# Patient Record
Sex: Female | Born: 1985
Health system: Southern US, Community
[De-identification: ages and names within clinical notes are randomized; demographics above are authoritative.]

## PROBLEM LIST (undated history)

## (undated) DIAGNOSIS — E559 Vitamin D deficiency, unspecified: Secondary | ICD-10-CM

## (undated) DIAGNOSIS — K649 Unspecified hemorrhoids: Secondary | ICD-10-CM

## (undated) DIAGNOSIS — M2550 Pain in unspecified joint: Secondary | ICD-10-CM

## (undated) DIAGNOSIS — J45909 Unspecified asthma, uncomplicated: Secondary | ICD-10-CM

## (undated) DIAGNOSIS — M549 Dorsalgia, unspecified: Secondary | ICD-10-CM

## (undated) DIAGNOSIS — M1990 Unspecified osteoarthritis, unspecified site: Secondary | ICD-10-CM

## (undated) DIAGNOSIS — E8881 Metabolic syndrome: Secondary | ICD-10-CM

## (undated) DIAGNOSIS — M4310 Spondylolisthesis, site unspecified: Secondary | ICD-10-CM

## (undated) DIAGNOSIS — K5900 Constipation, unspecified: Secondary | ICD-10-CM

## (undated) DIAGNOSIS — D649 Anemia, unspecified: Secondary | ICD-10-CM

## (undated) DIAGNOSIS — Z91018 Allergy to other foods: Secondary | ICD-10-CM

## (undated) DIAGNOSIS — E88819 Insulin resistance, unspecified: Secondary | ICD-10-CM

## (undated) HISTORY — DX: Allergy to other foods: Z91.018

## (undated) HISTORY — DX: Metabolic syndrome: E88.81

## (undated) HISTORY — DX: Insulin resistance, unspecified: E88.819

## (undated) HISTORY — DX: Pain in unspecified joint: M25.50

## (undated) HISTORY — DX: Constipation, unspecified: K59.00

## (undated) HISTORY — DX: Dorsalgia, unspecified: M54.9

## (undated) HISTORY — DX: Spondylolisthesis, site unspecified: M43.10

## (undated) HISTORY — DX: Anemia, unspecified: D64.9

## (undated) HISTORY — DX: Unspecified osteoarthritis, unspecified site: M19.90

## (undated) HISTORY — PX: TONSILECTOMY/ADENOIDECTOMY WITH MYRINGOTOMY: SHX6125

## (undated) HISTORY — DX: Unspecified hemorrhoids: K64.9

## (undated) HISTORY — DX: Vitamin D deficiency, unspecified: E55.9

---

## 2012-08-25 ENCOUNTER — Encounter (HOSPITAL_COMMUNITY): Payer: Self-pay | Admitting: *Deleted

## 2012-08-25 ENCOUNTER — Emergency Department (HOSPITAL_COMMUNITY)
Admission: EM | Admit: 2012-08-25 | Discharge: 2012-08-25 | Disposition: A | Discharge status: Home / Self Care | Payer: BC Managed Care – PPO | Attending: Emergency Medicine | Admitting: Emergency Medicine

## 2012-08-25 DIAGNOSIS — Z79899 Other long term (current) drug therapy: Secondary | ICD-10-CM | POA: Insufficient documentation

## 2012-08-25 DIAGNOSIS — O99891 Other specified diseases and conditions complicating pregnancy: Secondary | ICD-10-CM | POA: Insufficient documentation

## 2012-08-25 DIAGNOSIS — J45909 Unspecified asthma, uncomplicated: Secondary | ICD-10-CM | POA: Insufficient documentation

## 2012-08-25 DIAGNOSIS — R062 Wheezing: Secondary | ICD-10-CM | POA: Insufficient documentation

## 2012-08-25 DIAGNOSIS — T50Z95A Adverse effect of other vaccines and biological substances, initial encounter: Secondary | ICD-10-CM | POA: Insufficient documentation

## 2012-08-25 DIAGNOSIS — R0602 Shortness of breath: Secondary | ICD-10-CM | POA: Insufficient documentation

## 2012-08-25 DIAGNOSIS — T7840XA Allergy, unspecified, initial encounter: Secondary | ICD-10-CM

## 2012-08-25 HISTORY — DX: Unspecified asthma, uncomplicated: J45.909

## 2012-08-25 MED ORDER — METHYLPREDNISOLONE SODIUM SUCC 125 MG IJ SOLR
125.0000 mg | Freq: Once | INTRAMUSCULAR | Status: AC
  Administered 2012-08-25: 125 mg via INTRAVENOUS
  Filled 2012-08-25: qty 2

## 2012-08-25 MED ORDER — DIPHENHYDRAMINE HCL 50 MG/ML IJ SOLN
25.0000 mg | Freq: Once | INTRAMUSCULAR | Status: AC
  Administered 2012-08-25: 25 mg via INTRAVENOUS
  Filled 2012-08-25: qty 1

## 2012-08-25 MED ORDER — DIPHENHYDRAMINE HCL 25 MG PO CAPS: 25.0000 mg | ORAL_CAPSULE | Freq: Four times a day (QID) | ORAL | Status: DC | PRN

## 2012-08-25 MED ORDER — SODIUM CHLORIDE 0.9 % IV SOLN
10.0000 mg | Freq: Once | INTRAVENOUS | Status: AC
  Administered 2012-08-25: 10 mg via INTRAVENOUS
  Filled 2012-08-25: qty 1

## 2012-08-25 NOTE — ED Notes (Signed)
Patient had the flu shot and Tdap vaccines today and this evening she felt like her throat was closing, started to wheezing, and felt short of breath.  Patient is [redacted] weeks pregnant.

## 2012-08-25 NOTE — ED Provider Notes (Signed)
I saw and evaluated the patient, reviewed the resident's note and I agree with the findings and plan.  Pt without signs of anaphylaxis.  Feeling better in the ED.  At this time there does not appear to be any evidence of an acute emergency medical condition and the patient appears stable for discharge with appropriate outpatient follow up.   Celene Kras, MD 08/25/12 [phone]

## 2012-08-25 NOTE — ED Provider Notes (Signed)
History     CSN: [id number]  Arrival date & time 08/25/12  2136   First MD Initiated Contact with Patient 08/25/12 2147      Chief Complaint  Patient presents with  . Allergic Reaction    (Consider location/radiation/quality/duration/timing/severity/associated sxs/prior treatment) HPI Comments: The patient states she had sudden onset wheezing, sensation of throat closing and dry mouth, coughing and felt short of breath today. She has not had an allergic reaction like this before but states she believes it is an allergic reaction possibly to the vaccines she received earlier in the day. She denied any urticaria or itching, vomiting or abdominal pain, lightheadedness or syncope. It has gradually been improving since it first began but she is still mildly symptomatic.  Patient is a 26 y.o. female presenting with allergic reaction. The history is provided by the patient. No language interpreter was used.  Allergic Reaction The primary symptoms are  wheezing and shortness of breath. The primary symptoms do not include cough, abdominal pain, nausea, vomiting, diarrhea, dizziness, palpitations, altered mental status, rash, angioedema or urticaria. The current episode started 1 to 2 hours ago. The problem has been gradually improving. This is a new problem.  Wheezing began today. Wheezing occurs rarely. The wheezing has been gradually improving since its onset. It is unknown what precipitated the wheezing. The patient's medical history is significant for asthma (has not wheezed in years).  The shortness of breath began today. The shortness of breath developed suddenly. The shortness of breath is moderate. The patient's medical history is significant for asthma (has not wheezed in years).  The onset of the reaction was associated with a new medication (got tdap today). Significant symptoms that are not present include eye redness, flushing, rhinorrhea or itching.    Past Medical History  Diagnosis  Date  . Pregnant   . Asthma     Past Surgical History  Procedure Date  . Tonsilectomy/adenoidectomy with myringotomy     History reviewed. No pertinent family history.  History  Substance Use Topics  . Smoking status: Never Smoker   . Smokeless tobacco: Not on file  . Alcohol Use: No    OB History    Grav Para Term Preterm Abortions TAB SAB Ect Mult Living   3 1              Review of Systems  Constitutional: Negative for fever, chills, activity change and appetite change.  HENT: Negative for congestion, rhinorrhea, neck pain, neck stiffness and sinus pressure.   Eyes: Negative for discharge, redness and visual disturbance.  Respiratory: Positive for shortness of breath and wheezing. Negative for cough, chest tightness and stridor.   Cardiovascular: Negative for chest pain, palpitations and leg swelling.  Gastrointestinal: Positive for abdominal distention (pregnant). Negative for nausea, vomiting, abdominal pain and diarrhea.  Genitourinary: Negative for decreased urine volume and difficulty urinating.  Musculoskeletal: Negative for back pain and arthralgias.  Skin: Negative for color change, flushing, itching, pallor and rash.  Neurological: Negative for dizziness, weakness, light-headedness and headaches.  Psychiatric/Behavioral: Negative for behavioral problems, agitation and altered mental status.  All other systems reviewed and are negative.    Allergies  Review of patient's allergies indicates no known allergies.  Home Medications   Current Outpatient Rx  Name  Route  Sig  Dispense  Refill  . ALBUTEROL SULFATE HFA 108 (90 BASE) MCG/ACT IN AERS   Inhalation   Inhale 1 puff into the lungs every 6 (six) hours as needed. For shortness  of breath         . PRENATAL 27-0.8 MG PO TABS   Oral   Take 1 tablet by mouth daily.           BP 120/65  Pulse 104  Temp 98.1 F (36.7 C) (Oral)  Resp 20  SpO2 99%  Physical Exam  Nursing note and vitals  reviewed. Constitutional: She is oriented to person, place, and time. She appears well-developed and well-nourished. No distress.  HENT:  Head: Normocephalic and atraumatic.  Mouth/Throat: No oropharyngeal exudate.  Eyes: EOM are normal. Pupils are equal, round, and reactive to light. Right eye exhibits no discharge. Left eye exhibits no discharge.  Neck: Normal range of motion. Neck supple. No JVD present.  Cardiovascular: Normal rate, regular rhythm and normal heart sounds.   Pulmonary/Chest: Effort normal and breath sounds normal. No stridor. No respiratory distress. She has no wheezes. She has no rales. She exhibits no tenderness.  Abdominal: Soft. Bowel sounds are normal. She exhibits distension (Consistent with [redacted] weeks pregnant). There is no tenderness. There is no guarding.  Musculoskeletal: Normal range of motion. She exhibits no edema and no tenderness.  Neurological: She is alert and oriented to person, place, and time. No cranial nerve deficit. She exhibits normal muscle tone.  Skin: Skin is warm and dry. No rash noted. She is not diaphoretic.  Psychiatric: She has a normal mood and affect. Her behavior is normal. Judgment and thought content normal.    ED Course  Procedures (including critical care time)  Labs Reviewed - No data to display No results found.   1. Allergic reaction    MDM  10:09 PM status post allergic reaction. Unknown trigger but may have been vaccines she got earlier in the day today. She appears well now but does have some subjective complaints of throat feeling swollen and dry in mild wheezing and shortness of breath. No wheezing on exam. Will give Benadryl, Pepcid, Solu-Medrol for reaction and reassess. Not consistent with an anaphylactic reaction.  11:05 PM pt asymptomatic. Stable for dc. Pt deemed stable for discharge. Return precautions were provided and pt expressed understanding to return to ED if any acute symptoms return. Follow up was instructed  which pt also expressed understanding. All questions were answered and pt was in agreement w/ plan.        Warrick Parisian, MD 08/25/12 [phone]

## 2014-08-13 ENCOUNTER — Encounter (HOSPITAL_COMMUNITY): Payer: Self-pay | Admitting: *Deleted

## 2014-08-25 ENCOUNTER — Encounter (HOSPITAL_COMMUNITY): Payer: Self-pay

## 2014-08-25 ENCOUNTER — Emergency Department (HOSPITAL_COMMUNITY): Payer: No Typology Code available for payment source

## 2014-08-25 ENCOUNTER — Encounter (HOSPITAL_COMMUNITY): Payer: Self-pay | Admitting: Nurse Practitioner

## 2014-08-25 ENCOUNTER — Inpatient Hospital Stay (HOSPITAL_COMMUNITY)
Admission: EM | Admit: 2014-08-25 | Discharge: 2014-08-28 | DRG: 781 | Disposition: A | Discharge status: Home / Self Care | Payer: No Typology Code available for payment source | Attending: Internal Medicine | Admitting: Internal Medicine

## 2014-08-25 ENCOUNTER — Emergency Department (HOSPITAL_COMMUNITY)
Admission: EM | Admit: 2014-08-25 | Discharge: 2014-08-25 | Disposition: A | Discharge status: Home / Self Care | Payer: No Typology Code available for payment source | Attending: Emergency Medicine | Admitting: Emergency Medicine

## 2014-08-25 DIAGNOSIS — O99012 Anemia complicating pregnancy, second trimester: Secondary | ICD-10-CM | POA: Diagnosis present

## 2014-08-25 DIAGNOSIS — R51 Headache: Secondary | ICD-10-CM | POA: Insufficient documentation

## 2014-08-25 DIAGNOSIS — G039 Meningitis, unspecified: Secondary | ICD-10-CM

## 2014-08-25 DIAGNOSIS — Z79899 Other long term (current) drug therapy: Secondary | ICD-10-CM | POA: Insufficient documentation

## 2014-08-25 DIAGNOSIS — J45909 Unspecified asthma, uncomplicated: Secondary | ICD-10-CM | POA: Diagnosis present

## 2014-08-25 DIAGNOSIS — M1990 Unspecified osteoarthritis, unspecified site: Secondary | ICD-10-CM | POA: Diagnosis present

## 2014-08-25 DIAGNOSIS — O26892 Other specified pregnancy related conditions, second trimester: Principal | ICD-10-CM | POA: Diagnosis present

## 2014-08-25 DIAGNOSIS — O99512 Diseases of the respiratory system complicating pregnancy, second trimester: Secondary | ICD-10-CM | POA: Diagnosis not present

## 2014-08-25 DIAGNOSIS — O9989 Other specified diseases and conditions complicating pregnancy, childbirth and the puerperium: Secondary | ICD-10-CM | POA: Insufficient documentation

## 2014-08-25 DIAGNOSIS — D649 Anemia, unspecified: Secondary | ICD-10-CM | POA: Diagnosis present

## 2014-08-25 DIAGNOSIS — O0990 Supervision of high risk pregnancy, unspecified, unspecified trimester: Secondary | ICD-10-CM

## 2014-08-25 DIAGNOSIS — R519 Headache, unspecified: Secondary | ICD-10-CM

## 2014-08-25 DIAGNOSIS — Z3490 Encounter for supervision of normal pregnancy, unspecified, unspecified trimester: Secondary | ICD-10-CM

## 2014-08-25 DIAGNOSIS — Z3A19 19 weeks gestation of pregnancy: Secondary | ICD-10-CM | POA: Diagnosis present

## 2014-08-25 DIAGNOSIS — Z3A20 20 weeks gestation of pregnancy: Secondary | ICD-10-CM | POA: Insufficient documentation

## 2014-08-25 DIAGNOSIS — G030 Nonpyogenic meningitis: Secondary | ICD-10-CM | POA: Diagnosis present

## 2014-08-25 MED ORDER — PROMETHAZINE HCL 25 MG PO TABS: 25.0000 mg | ORAL_TABLET | Freq: Four times a day (QID) | ORAL | Status: DC | PRN

## 2014-08-25 MED ORDER — SODIUM CHLORIDE 0.9 % IV BOLUS (SEPSIS)
1000.0000 mL | Freq: Once | INTRAVENOUS | Status: AC
  Administered 2014-08-25: 1000 mL via INTRAVENOUS

## 2014-08-25 MED ORDER — PROMETHAZINE HCL 25 MG/ML IJ SOLN
12.5000 mg | Freq: Once | INTRAMUSCULAR | Status: AC
  Administered 2014-08-25: 12.5 mg via INTRAVENOUS
  Filled 2014-08-25: qty 1

## 2014-08-25 MED ORDER — MORPHINE SULFATE 4 MG/ML IJ SOLN
4.0000 mg | Freq: Once | INTRAMUSCULAR | Status: AC
  Administered 2014-08-25: 4 mg via INTRAVENOUS
  Filled 2014-08-25: qty 1

## 2014-08-25 MED ORDER — OXYCODONE-ACETAMINOPHEN 5-325 MG PO TABS: 2.0000 | ORAL_TABLET | ORAL | Status: DC | PRN

## 2014-08-25 MED ORDER — SODIUM CHLORIDE 0.9 % IV SOLN
Freq: Once | INTRAVENOUS | Status: AC
  Administered 2014-08-26: via INTRAVENOUS

## 2014-08-25 MED ORDER — MORPHINE SULFATE 2 MG/ML IJ SOLN
2.0000 mg | Freq: Once | INTRAMUSCULAR | Status: AC
  Administered 2014-08-25: 2 mg via INTRAVENOUS
  Filled 2014-08-25: qty 1

## 2014-08-25 MED ORDER — HYDROCODONE-ACETAMINOPHEN 5-325 MG PO TABS: 2.0000 | ORAL_TABLET | ORAL | Status: DC | PRN

## 2014-08-25 MED ORDER — MORPHINE SULFATE 2 MG/ML IJ SOLN
2.0000 mg | Freq: Once | INTRAMUSCULAR | Status: AC
  Administered 2014-08-26: 2 mg via INTRAVENOUS
  Filled 2014-08-25: qty 1

## 2014-08-25 MED ORDER — ACETAMINOPHEN 500 MG PO TABS
1000.0000 mg | ORAL_TABLET | Freq: Once | ORAL | Status: AC
  Administered 2014-08-25: 1000 mg via ORAL
  Filled 2014-08-25: qty 2

## 2014-08-25 NOTE — ED Notes (Signed)
Patient to MRI.

## 2014-08-25 NOTE — ED Provider Notes (Signed)
CSN: [id number]     Arrival date & time 08/25/14  0728 History   First MD Initiated Contact with Patient 08/25/14 [phone]     Chief Complaint  Patient presents with  . Headache     (Consider location/radiation/quality/duration/timing/severity/associated sxs/prior Treatment) HPI Comments: The patient is a 28 year old female, history of G3 P2 at [redacted] weeks gestation with an uncomplicated pregnancy thus far. She presents with a complaint of a headache, this started at 10:00 last night as an aching feeling across her frontal forehead, it is persistent and seems to get much worse when she leans over. It is better when she stands up or sits up. She denies any associated neurologic symptoms and denies any fevers chills nausea vomiting blurred vision or change in vision, stiff neck, lymphadenopathy, weakness, numbness, ataxia and has no chest pain abdominal pain back pain dysuria diarrhea or rashes. She takes no medications other than a prenatal vitamin and has taken nothing for her headache prior to arrival.  The history is provided by the patient.    Past Medical History  Diagnosis Date  . Pregnant   . Asthma    Past Surgical History  Procedure Laterality Date  . Tonsilectomy/adenoidectomy with myringotomy     No family history on file. History  Substance Use Topics  . Smoking status: Never Smoker   . Smokeless tobacco: Not on file  . Alcohol Use: No   OB History    Gravida Para Term Preterm AB TAB SAB Ectopic Multiple Living   3 1             Review of Systems  All other systems reviewed and are negative.     Allergies  Review of patient's allergies indicates no known allergies.  Home Medications   Prior to Admission medications   Medication Sig Start Date End Date Taking? Authorizing Provider  Ascorbic Acid (VITAMIN C PO) Take 1 tablet by mouth daily.   Yes Historical Provider, MD  Cholecalciferol (VITAMIN D PO) Take 1 tablet by mouth daily.   Yes Historical Provider, MD   [facility] Liver Oil (COD LIVER PO) Take 5 mLs by mouth daily.   Yes Historical Provider, MD  Prenatal Vit-Fe Fumarate-FA (MULTIVITAMIN-PRENATAL) 27-0.8 MG TABS Take 1 tablet by mouth daily.   Yes Historical Provider, MD  albuterol (PROVENTIL HFA;VENTOLIN HFA) 108 (90 BASE) MCG/ACT inhaler Inhale 1 puff into the lungs every 6 (six) hours as needed for wheezing or shortness of breath.     Historical Provider, MD  diphenhydrAMINE (BENADRYL) 25 mg capsule Take 1 capsule (25 mg total) by mouth every 6 (six) hours as needed for itching or allergies. Patient not taking: Reported on 08/25/2014 08/25/12   Warrick Parisian, MD  HYDROcodone-acetaminophen (NORCO/VICODIN) 5-325 MG per tablet Take 2 tablets by mouth every 4 (four) hours as needed. 08/25/14   Vida Roller, MD   BP 106/60 mmHg  Pulse 78  Temp(Src) 98 F (36.7 C) (Oral)  Resp 13  SpO2 100% Physical Exam  Constitutional: She appears well-developed and well-nourished. No distress.  HENT:  Head: Normocephalic and atraumatic.  Mouth/Throat: Oropharynx is clear and moist. No oropharyngeal exudate.  Clear oropharynx, no tenderness over the sinuses, clear nasal passages  Eyes: Conjunctivae and EOM are normal. Pupils are equal, round, and reactive to light. Right eye exhibits no discharge. Left eye exhibits no discharge. No scleral icterus.  Neck: Normal range of motion. Neck supple. No JVD present. No thyromegaly present.  Very supple neck, no lymphadenopathy  Cardiovascular:  Normal rate, regular rhythm, normal heart sounds and intact distal pulses.  Exam reveals no gallop and no friction rub.   No murmur heard. No carotid bruit  Pulmonary/Chest: Effort normal and breath sounds normal. No respiratory distress. She has no wheezes. She has no rales.  Abdominal: Soft. Bowel sounds are normal. She exhibits no distension and no mass. There is no tenderness.  Musculoskeletal: Normal range of motion. She exhibits no edema or tenderness.  Lymphadenopathy:     She has no cervical adenopathy.  Neurological: She is alert. Coordination normal.  Speech is clear, cranial nerves III through XII are intact, memory is intact, strength is normal in all 4 extremities including grips, sensation is intact to light touch and pinprick in all 4 extremities. Coordination as tested by finger-nose-finger is normal, no limb ataxia. Normal gait, normal reflexes at the patellar tendons bilaterally  Skin: Skin is warm and dry. No rash noted. No erythema.  Psychiatric: She has a normal mood and affect. Her behavior is normal.  Nursing note and vitals reviewed.   ED Course  Procedures (including critical care time) Labs Review Labs Reviewed - No data to display  Imaging Review Mr Brain Wo Contrast  08/25/2014   CLINICAL DATA:  Severe headaches since yesterday. Second-trimester pregnancy (19 weeks)  EXAM: MRI HEAD WITHOUT CONTRAST  MRV HEAD WITHOUT CONTRAST  TECHNIQUE: Multiplanar, multiecho pulse sequences of the brain and surrounding structures were obtained. Angiographic images of the intracranial venous structures were obtained using MRV technique without intravenous contrast.  COMPARISON:  None.  : FINDINGS:  The brain has a normal appearance on all pulse sequences without evidence of malformation, atrophy, old or acute infarction, mass lesion, hemorrhage, hydrocephalus or extra-axial collection. No pituitary mass. No fluid in the sinuses, middle ears or mastoids. No skull or skullbase lesion. There is flow in the major vessels at the base of the brain. Major venous sinuses show flow.  MR venography shows normal appearance of the sinuses and deep venous system. No sign of venous thrombosis.  IMPRESSION: Normal MRI of the brain.  Normal intracranial MR venography.   Electronically Signed   By: Paulina Fusi M.D.   On: 08/25/2014 10:19   Mr Alexandria Lodge  08/25/2014   CLINICAL DATA:  Severe headaches since yesterday. Second-trimester pregnancy (19 weeks)  EXAM: MRI HEAD  WITHOUT CONTRAST  MRV HEAD WITHOUT CONTRAST  TECHNIQUE: Multiplanar, multiecho pulse sequences of the brain and surrounding structures were obtained. Angiographic images of the intracranial venous structures were obtained using MRV technique without intravenous contrast.  COMPARISON:  None.  : FINDINGS:  The brain has a normal appearance on all pulse sequences without evidence of malformation, atrophy, old or acute infarction, mass lesion, hemorrhage, hydrocephalus or extra-axial collection. No pituitary mass. No fluid in the sinuses, middle ears or mastoids. No skull or skullbase lesion. There is flow in the major vessels at the base of the brain. Major venous sinuses show flow.  MR venography shows normal appearance of the sinuses and deep venous system. No sign of venous thrombosis.  IMPRESSION: Normal MRI of the brain.  Normal intracranial MR venography.   Electronically Signed   By: Paulina Fusi M.D.   On: 08/25/2014 10:19      MDM   Final diagnoses:  Headache    Tylenol, fluids, orthostatics, rule out venous sinus thrombosis.  MRI normal - no clot, no mass, tylenol without improvemetn - pt decliens meds as she has to drive home.  Slight orthostatic change -  fluids given, pt tolerating PO wihout difficulty  Meds given in ED:  Medications  acetaminophen (TYLENOL) tablet 1,000 mg (1,000 mg Oral Given 08/25/14 0807)  sodium chloride 0.9 % bolus 1,000 mL (0 mLs Intravenous Stopped 08/25/14 1015)    New Prescriptions   HYDROCODONE-ACETAMINOPHEN (NORCO/VICODIN) 5-325 MG PER TABLET    Take 2 tablets by mouth every 4 (four) hours as needed.      Vida Roller, MD 08/25/14 [phone]

## 2014-08-25 NOTE — ED Notes (Signed)
Patient still in MRI at this time

## 2014-08-25 NOTE — ED Notes (Signed)
Patient returned from MRI.

## 2014-08-25 NOTE — ED Notes (Signed)
Patient presents today with a chief complaint throbbing headache that has been present since 2200 last night. She reports pain is worse when changing position specifically leaning forward. Patient is [redacted] weeks pregnant and has not taken anything for pain.

## 2014-08-25 NOTE — Discharge Instructions (Signed)
New prescription for pain medicine. Also Rx for nausea medication. Increase fluids. Follow-up your primary care doctor.

## 2014-08-25 NOTE — Discharge Instructions (Signed)
Your MRI was normal - take the medicine as prescribed for severe pain.  No more than 4000 mg of tylenol a day - the vicodin does contain tylenol  Please call your doctor for a followup appointment within 24-48 hours. When you talk to your doctor please let them know that you were seen in the emergency department and have them acquire all of your records so that they can discuss the findings with you and formulate a treatment plan to fully care for your new and ongoing problems.

## 2014-08-25 NOTE — ED Provider Notes (Addendum)
CSN: [id number]     Arrival date & time 08/25/14  1812 History   First MD Initiated Contact with Patient 08/25/14 1824     Chief Complaint  Patient presents with  . Headache     (Consider location/radiation/quality/duration/timing/severity/associated sxs/prior Treatment) HPI.Marland KitchenMarland KitchenMarland KitchenMarland Kitchenpersistent frontal and occipital headache starting at 2200 last night.  Patient evaluated in emergency department earlier today at approximately 11 AM. MRI scan of head negative for acute findings. Patient is pregnant. No vaginal bleeding or discharge. No neurological deficits, stiff neck, fever, chills.  Patient is G3P2  At [redacted] weeks pregnant.  Past Medical History  Diagnosis Date  . Pregnant   . Asthma    Past Surgical History  Procedure Laterality Date  . Tonsilectomy/adenoidectomy with myringotomy     History reviewed. No pertinent family history. History  Substance Use Topics  . Smoking status: Never Smoker   . Smokeless tobacco: Not on file  . Alcohol Use: No   OB History    Gravida Para Term Preterm AB TAB SAB Ectopic Multiple Living   3 1             Review of Systems  All other systems reviewed and are negative.     Allergies  Review of patient's allergies indicates no known allergies.  Home Medications   Prior to Admission medications   Medication Sig Start Date End Date Taking? Authorizing Provider  albuterol (PROVENTIL HFA;VENTOLIN HFA) 108 (90 BASE) MCG/ACT inhaler Inhale 1 puff into the lungs every 6 (six) hours as needed for wheezing or shortness of breath.    Yes Historical Provider, MD  Ascorbic Acid (VITAMIN C PO) Take 1 tablet by mouth daily.   Yes Historical Provider, MD  Cholecalciferol (VITAMIN D PO) Take 1 tablet by mouth daily.   Yes Historical Provider, MD  [facility] Liver Oil (COD LIVER PO) Take 5 mLs by mouth daily.   Yes Historical Provider, MD  Prenatal Vit-Fe Fumarate-FA (MULTIVITAMIN-PRENATAL) 27-0.8 MG TABS Take 1 tablet by mouth daily.   Yes Historical Provider, MD   diphenhydrAMINE (BENADRYL) 25 mg capsule Take 1 capsule (25 mg total) by mouth every 6 (six) hours as needed for itching or allergies. Patient not taking: Reported on 08/25/2014 08/25/12   Warrick Parisian, MD  HYDROcodone-acetaminophen (NORCO/VICODIN) 5-325 MG per tablet Take 2 tablets by mouth every 4 (four) hours as needed. 08/25/14   Vida Roller, MD  oxyCODONE-acetaminophen (PERCOCET) 5-325 MG per tablet Take 2 tablets by mouth every 4 (four) hours as needed. 08/25/14   Donnetta Hutching, MD  oxyCODONE-acetaminophen (PERCOCET) 5-325 MG per tablet Take 2 tablets by mouth every 4 (four) hours as needed. 08/26/14   Donnetta Hutching, MD  promethazine (PHENERGAN) 25 MG tablet Take 1 tablet (25 mg total) by mouth every 6 (six) hours as needed for nausea. 08/25/14   Donnetta Hutching, MD  promethazine (PHENERGAN) 25 MG tablet Take 1 tablet (25 mg total) by mouth every 6 (six) hours as needed for nausea. 08/26/14   Donnetta Hutching, MD   BP 106/61 mmHg  Pulse 96  Temp(Src) 99.7 F (37.6 C) (Oral)  Resp 16  SpO2 100% Physical Exam  Constitutional: She is oriented to person, place, and time. She appears well-developed and well-nourished.  HENT:  Head: Normocephalic and atraumatic.  Eyes: Conjunctivae and EOM are normal. Pupils are equal, round, and reactive to light.  Neck: Normal range of motion. Neck supple.  Cardiovascular: Normal rate, regular rhythm and normal heart sounds.   Pulmonary/Chest: Effort normal and breath  sounds normal.  Abdominal: Soft. Bowel sounds are normal.  Musculoskeletal: Normal range of motion.  Neurological: She is alert and oriented to person, place, and time.  Skin: Skin is warm and dry.  Psychiatric: She has a normal mood and affect. Her behavior is normal.  Nursing note and vitals reviewed.   ED Course  LUMBAR PUNCTURE Date/Time: 08/25/2014 11:30 PM Performed by: Donnetta Hutching Authorized by: Donnetta Hutching Consent: Verbal consent obtained. Written consent obtained. Risks and  benefits: risks, benefits and alternatives were discussed Consent given by: patient Patient understanding: patient states understanding of the procedure being performed Patient consent: the patient's understanding of the procedure matches consent given Procedure consent: procedure consent matches procedure scheduled Relevant documents: relevant documents present and verified Test results: test results available and properly labeled Site marked: the operative site was marked Patient identity confirmed: verbally with patient Time out: Immediately prior to procedure a "time out" was called to verify the correct patient, procedure, equipment, support staff and site/side marked as required. Indications: evaluation for infection Anesthesia: local infiltration Local anesthetic: lidocaine 1% without epinephrine Anesthetic total: 5 ml Preparation: Patient was prepped and draped in the usual sterile fashion. Lumbar space: L3-L4 interspace Patient's position: sitting Needle gauge: 20 Needle type: spinal needle - Quincke tip Number of attempts: 2 Fluid appearance: clear Tubes of fluid: 4 Post-procedure: site cleaned and adhesive bandage applied Patient tolerance: Patient tolerated the procedure well with no immediate complications   (including critical care time) Labs Review Labs Reviewed  BASIC METABOLIC PANEL - Abnormal; Notable for the following:    Sodium 134 (*)    Potassium 3.4 (*)    CO2 16 (*)    BUN 4 (*)    Calcium 8.0 (*)    All other components within normal limits  CBC WITH DIFFERENTIAL - Abnormal; Notable for the following:    RBC 3.48 (*)    Hemoglobin 10.5 (*)    HCT 32.9 (*)    All other components within normal limits  PROTEIN, CSF - Abnormal; Notable for the following:    Total  Protein, CSF 47 (*)    All other components within normal limits  RAPID STREP SCREEN  GRAM STAIN  CSF CULTURE  CULTURE, GROUP A STREP  MONONUCLEOSIS SCREEN  GLUCOSE, CSF  CSF CELL COUNT  WITH DIFFERENTIAL  CSF CELL COUNT WITH DIFFERENTIAL    Imaging Review Mr Brain Wo Contrast  08/25/2014   CLINICAL DATA:  Severe headaches since yesterday. Second-trimester pregnancy (19 weeks)  EXAM: MRI HEAD WITHOUT CONTRAST  MRV HEAD WITHOUT CONTRAST  TECHNIQUE: Multiplanar, multiecho pulse sequences of the brain and surrounding structures were obtained. Angiographic images of the intracranial venous structures were obtained using MRV technique without intravenous contrast.  COMPARISON:  None.  : FINDINGS:  The brain has a normal appearance on all pulse sequences without evidence of malformation, atrophy, old or acute infarction, mass lesion, hemorrhage, hydrocephalus or extra-axial collection. No pituitary mass. No fluid in the sinuses, middle ears or mastoids. No skull or skullbase lesion. There is flow in the major vessels at the base of the brain. Major venous sinuses show flow.  MR venography shows normal appearance of the sinuses and deep venous system. No sign of venous thrombosis.  IMPRESSION: Normal MRI of the brain.  Normal intracranial MR venography.   Electronically Signed   By: Paulina Fusi M.D.   On: 08/25/2014 10:19   Mr Alexandria Lodge  08/25/2014   CLINICAL DATA:  Severe headaches since yesterday.  Second-trimester pregnancy (19 weeks)  EXAM: MRI HEAD WITHOUT CONTRAST  MRV HEAD WITHOUT CONTRAST  TECHNIQUE: Multiplanar, multiecho pulse sequences of the brain and surrounding structures were obtained. Angiographic images of the intracranial venous structures were obtained using MRV technique without intravenous contrast.  COMPARISON:  None.  : FINDINGS:  The brain has a normal appearance on all pulse sequences without evidence of malformation, atrophy, old or acute infarction, mass lesion, hemorrhage, hydrocephalus or extra-axial collection. No pituitary mass. No fluid in the sinuses, middle ears or mastoids. No skull or skullbase lesion. There is flow in the major vessels at the base of the  brain. Major venous sinuses show flow.  MR venography shows normal appearance of the sinuses and deep venous system. No sign of venous thrombosis.  IMPRESSION: Normal MRI of the brain.  Normal intracranial MR venography.   Electronically Signed   By: Paulina Fusi M.D.   On: 08/25/2014 10:19     EKG Interpretation None      MDM   Final diagnoses:  Headache, unspecified headache type    Patient feels better after 2 L of IV fluid and pain management. No evidence of meningitis.  Discharge medications Percocet and Phenergan 25 mg.  Recheck at 2305:   Patient is febrile. She has a mild sore neck. Will obtain lumbar puncture to rule out meningitis. Discussed with the patient in great detail. 0100:  Discussed with Dr. Norlene Campbell. Donnetta Hutching, MD 08/25/14 1610  Donnetta Hutching, MD 08/25/14 [id number]  Donnetta Hutching, MD 08/25/14 5409  Donnetta Hutching, MD 08/26/14 [phone]

## 2014-08-25 NOTE — ED Notes (Signed)
The pt was here this am for headache, she was discharged home with vicodin. She took 2 doses and it is not helping the pain. States now she feels her vision is getting worse. She is [redacted] weeks pregnant and denies any history of headaches.  She describes the pain as sharp, shootin,g intense ache

## 2014-08-25 NOTE — ED Notes (Signed)
Graham crackers provided to patient.

## 2014-08-26 ENCOUNTER — Inpatient Hospital Stay (HOSPITAL_COMMUNITY): Payer: No Typology Code available for payment source

## 2014-08-26 DIAGNOSIS — D649 Anemia, unspecified: Secondary | ICD-10-CM | POA: Diagnosis present

## 2014-08-26 DIAGNOSIS — O0990 Supervision of high risk pregnancy, unspecified, unspecified trimester: Secondary | ICD-10-CM

## 2014-08-26 DIAGNOSIS — O26892 Other specified pregnancy related conditions, second trimester: Secondary | ICD-10-CM | POA: Diagnosis present

## 2014-08-26 DIAGNOSIS — R51 Headache: Secondary | ICD-10-CM | POA: Diagnosis present

## 2014-08-26 DIAGNOSIS — Z3A19 19 weeks gestation of pregnancy: Secondary | ICD-10-CM | POA: Diagnosis present

## 2014-08-26 DIAGNOSIS — M1990 Unspecified osteoarthritis, unspecified site: Secondary | ICD-10-CM | POA: Diagnosis present

## 2014-08-26 DIAGNOSIS — G030 Nonpyogenic meningitis: Secondary | ICD-10-CM | POA: Diagnosis present

## 2014-08-26 DIAGNOSIS — J45909 Unspecified asthma, uncomplicated: Secondary | ICD-10-CM | POA: Diagnosis present

## 2014-08-26 DIAGNOSIS — Z3490 Encounter for supervision of normal pregnancy, unspecified, unspecified trimester: Secondary | ICD-10-CM

## 2014-08-26 DIAGNOSIS — J4520 Mild intermittent asthma, uncomplicated: Secondary | ICD-10-CM

## 2014-08-26 DIAGNOSIS — R519 Headache, unspecified: Secondary | ICD-10-CM | POA: Insufficient documentation

## 2014-08-26 DIAGNOSIS — G039 Meningitis, unspecified: Secondary | ICD-10-CM | POA: Diagnosis present

## 2014-08-26 DIAGNOSIS — O99012 Anemia complicating pregnancy, second trimester: Secondary | ICD-10-CM | POA: Diagnosis present

## 2014-08-26 LAB — CBC WITH DIFFERENTIAL/PLATELET
Basophils Absolute: 0 10*3/uL (ref 0.0–0.1)
Basophils Relative: 0 % (ref 0–1)
Eosinophils Absolute: 0 10*3/uL (ref 0.0–0.7)
Eosinophils Relative: 0 % (ref 0–5)
HCT: 32.9 % — ABNORMAL LOW (ref 36.0–46.0)
Hemoglobin: 10.5 g/dL — ABNORMAL LOW (ref 12.0–15.0)
Lymphocytes Relative: 21 % (ref 12–46)
Lymphs Abs: 1.8 10*3/uL (ref 0.7–4.0)
MCH: 30.2 pg (ref 26.0–34.0)
MCHC: 31.9 g/dL (ref 30.0–36.0)
MCV: 94.5 fL (ref 78.0–100.0)
Monocytes Absolute: 0.5 10*3/uL (ref 0.1–1.0)
Monocytes Relative: 5 % (ref 3–12)
Neutro Abs: 6.5 10*3/uL (ref 1.7–7.7)
Neutrophils Relative %: 74 % (ref 43–77)
Platelets: 204 10*3/uL (ref 150–400)
RBC: 3.48 MIL/uL — ABNORMAL LOW (ref 3.87–5.11)
RDW: 13.8 % (ref 11.5–15.5)
WBC: 8.8 10*3/uL (ref 4.0–10.5)

## 2014-08-26 LAB — BASIC METABOLIC PANEL
Anion gap: 11 (ref 5–15)
Anion gap: 13 (ref 5–15)
BUN: 4 mg/dL — ABNORMAL LOW (ref 6–23)
BUN: 4 mg/dL — ABNORMAL LOW (ref 6–23)
CO2: 16 mEq/L — ABNORMAL LOW (ref 19–32)
CO2: 18 mEq/L — ABNORMAL LOW (ref 19–32)
Calcium: 7.8 mg/dL — ABNORMAL LOW (ref 8.4–10.5)
Calcium: 8 mg/dL — ABNORMAL LOW (ref 8.4–10.5)
Chloride: 105 mEq/L (ref 96–112)
Chloride: 107 mEq/L (ref 96–112)
Creatinine, Ser: 0.49 mg/dL — ABNORMAL LOW (ref 0.50–1.10)
Creatinine, Ser: 0.53 mg/dL (ref 0.50–1.10)
GFR calc Af Amer: >90 mL/min (ref >90)
GFR calc Af Amer: >90 mL/min (ref >90)
GFR calc non Af Amer: >90 mL/min (ref >90)
GFR calc non Af Amer: >90 mL/min (ref >90)
Glucose, Bld: 93 mg/dL (ref 70–99)
Glucose, Bld: 97 mg/dL (ref 70–99)
Potassium: 3.4 mEq/L — ABNORMAL LOW (ref 3.7–5.3)
Potassium: 3.7 mEq/L (ref 3.7–5.3)
Sodium: 134 mEq/L — ABNORMAL LOW (ref 137–147)
Sodium: 136 mEq/L — ABNORMAL LOW (ref 137–147)

## 2014-08-26 LAB — CSF CELL COUNT WITH DIFFERENTIAL
Eosinophils, CSF: 3 % — ABNORMAL HIGH (ref 0–1)
Eosinophils, CSF: 4 % — ABNORMAL HIGH (ref 0–1)
Lymphs, CSF: 2 % — ABNORMAL LOW (ref 40–80)
Lymphs, CSF: 5 % — ABNORMAL LOW (ref 40–80)
Monocyte-Macrophage-Spinal Fluid: 6 % — ABNORMAL LOW (ref 15–45)
Monocyte-Macrophage-Spinal Fluid: 8 % — ABNORMAL LOW (ref 15–45)
RBC Count, CSF: 14 /mm3 — ABNORMAL HIGH
RBC Count, CSF: 58 /mm3 — ABNORMAL HIGH
Segmented Neutrophils-CSF: 86 % — ABNORMAL HIGH (ref 0–6)
Segmented Neutrophils-CSF: 86 % — ABNORMAL HIGH (ref 0–6)
Tube #: 1
Tube #: 4
WBC, CSF: 224 /mm3 (ref 0–5)
WBC, CSF: 250 /mm3 (ref 0–5)

## 2014-08-26 LAB — CBC
HCT: 34.2 % — ABNORMAL LOW (ref 36.0–46.0)
Hemoglobin: 11.5 g/dL — ABNORMAL LOW (ref 12.0–15.0)
MCH: 30.7 pg (ref 26.0–34.0)
MCHC: 33.6 g/dL (ref 30.0–36.0)
MCV: 91.4 fL (ref 78.0–100.0)
Platelets: 202 10*3/uL (ref 150–400)
RBC: 3.74 MIL/uL — ABNORMAL LOW (ref 3.87–5.11)
RDW: 13.5 % (ref 11.5–15.5)
WBC: 8.5 10*3/uL (ref 4.0–10.5)

## 2014-08-26 LAB — GRAM STAIN: Special Requests: NORMAL

## 2014-08-26 LAB — HEPATIC FUNCTION PANEL
ALT: 11 U/L (ref 0–35)
AST: 12 U/L (ref 0–37)
Albumin: 2.5 g/dL — ABNORMAL LOW (ref 3.5–5.2)
Alkaline Phosphatase: 53 U/L (ref 39–117)
Bilirubin, Direct: <0.2 mg/dL (ref 0.0–0.3)
Total Bilirubin: <0.2 mg/dL — ABNORMAL LOW (ref 0.3–1.2)
Total Protein: 5.8 g/dL — ABNORMAL LOW (ref 6.0–8.3)

## 2014-08-26 LAB — GLUCOSE, CSF: Glucose, CSF: 57 mg/dL (ref 43–76)

## 2014-08-26 LAB — PROTEIN, CSF: Total  Protein, CSF: 47 mg/dL — ABNORMAL HIGH (ref 15–45)

## 2014-08-26 LAB — MONONUCLEOSIS SCREEN: Mono Screen: NEGATIVE

## 2014-08-26 LAB — RAPID STREP SCREEN (MED CTR MEBANE ONLY): Streptococcus, Group A Screen (Direct): NEGATIVE

## 2014-08-26 MED ORDER — OXYCODONE HCL 5 MG PO TABS
5.0000 mg | ORAL_TABLET | ORAL | Status: DC | PRN
  Administered 2014-08-26 (×2): 5 mg via ORAL
  Filled 2014-08-26 (×2): qty 1

## 2014-08-26 MED ORDER — SODIUM CHLORIDE 0.9 % IV SOLN
2.0000 g | INTRAVENOUS | Status: DC
  Administered 2014-08-26: 2 g via INTRAVENOUS
  Filled 2014-08-26 (×6): qty 2000

## 2014-08-26 MED ORDER — PRENATAL 27-0.8 MG PO TABS
1.0000 | ORAL_TABLET | Freq: Every day | ORAL | Status: DC
  Administered 2014-08-26 – 2014-08-28 (×3): 1 via ORAL
  Filled 2014-08-26 (×3): qty 1

## 2014-08-26 MED ORDER — ALBUTEROL SULFATE (2.5 MG/3ML) 0.083% IN NEBU: 2.5000 mg | INHALATION_SOLUTION | Freq: Four times a day (QID) | RESPIRATORY_TRACT | Status: DC | PRN

## 2014-08-26 MED ORDER — ALBUTEROL SULFATE HFA 108 (90 BASE) MCG/ACT IN AERS: 1.0000 | INHALATION_SPRAY | Freq: Four times a day (QID) | RESPIRATORY_TRACT | Status: DC | PRN

## 2014-08-26 MED ORDER — ALUM & MAG HYDROXIDE-SIMETH 200-200-20 MG/5ML PO SUSP: 30.0000 mL | Freq: Four times a day (QID) | ORAL | Status: DC | PRN

## 2014-08-26 MED ORDER — DEXTROSE 5 % IV SOLN
2.0000 g | INTRAVENOUS | Status: DC
  Administered 2014-08-26: 2 g via INTRAVENOUS
  Filled 2014-08-26: qty 2

## 2014-08-26 MED ORDER — SODIUM CHLORIDE 0.9 % IV SOLN
2.0000 g | Freq: Once | INTRAVENOUS | Status: AC
  Administered 2014-08-26: 2 g via INTRAVENOUS
  Filled 2014-08-26: qty 2000

## 2014-08-26 MED ORDER — HYDROMORPHONE HCL 1 MG/ML IJ SOLN
0.5000 mg | INTRAMUSCULAR | Status: DC | PRN
  Administered 2014-08-26 (×3): 1 mg via INTRAVENOUS
  Filled 2014-08-26 (×3): qty 1

## 2014-08-26 MED ORDER — CEFTRIAXONE SODIUM 2 G IJ SOLR
2.0000 g | Freq: Two times a day (BID) | INTRAMUSCULAR | Status: DC
  Administered 2014-08-26 – 2014-08-27 (×3): 2 g via INTRAVENOUS
  Filled 2014-08-26 (×4): qty 2

## 2014-08-26 MED ORDER — ACETAMINOPHEN 325 MG PO TABS
650.0000 mg | ORAL_TABLET | Freq: Four times a day (QID) | ORAL | Status: DC | PRN
  Administered 2014-08-27 (×4): 650 mg via ORAL
  Filled 2014-08-26 (×4): qty 2

## 2014-08-26 MED ORDER — OXYCODONE-ACETAMINOPHEN 5-325 MG PO TABS: 2.0000 | ORAL_TABLET | ORAL | Status: DC | PRN

## 2014-08-26 MED ORDER — VANCOMYCIN HCL IN DEXTROSE 1-5 GM/200ML-% IV SOLN
1000.0000 mg | Freq: Three times a day (TID) | INTRAVENOUS | Status: DC
  Administered 2014-08-26: 1000 mg via INTRAVENOUS
  Filled 2014-08-26 (×2): qty 200

## 2014-08-26 MED ORDER — SODIUM CHLORIDE 0.9 % IV BOLUS (SEPSIS)
1000.0000 mL | Freq: Once | INTRAVENOUS | Status: AC
  Administered 2014-08-26: 1000 mL via INTRAVENOUS

## 2014-08-26 MED ORDER — ONDANSETRON HCL 4 MG PO TABS: 4.0000 mg | ORAL_TABLET | Freq: Four times a day (QID) | ORAL | Status: DC | PRN

## 2014-08-26 MED ORDER — VITAMIN D3 25 MCG (1000 UNIT) PO TABS
1000.0000 [IU] | ORAL_TABLET | Freq: Every day | ORAL | Status: DC
  Administered 2014-08-26 – 2014-08-28 (×3): 1000 [IU] via ORAL
  Filled 2014-08-26 (×4): qty 1

## 2014-08-26 MED ORDER — VITAMIN C 500 MG PO TABS
500.0000 mg | ORAL_TABLET | Freq: Every day | ORAL | Status: DC
  Administered 2014-08-26 – 2014-08-28 (×3): 500 mg via ORAL
  Filled 2014-08-26 (×3): qty 1

## 2014-08-26 MED ORDER — VANCOMYCIN HCL IN DEXTROSE 1-5 GM/200ML-% IV SOLN
1000.0000 mg | Freq: Once | INTRAVENOUS | Status: AC
  Administered 2014-08-26: 1000 mg via INTRAVENOUS
  Filled 2014-08-26: qty 200

## 2014-08-26 MED ORDER — ONDANSETRON HCL 4 MG/2ML IJ SOLN: 4.0000 mg | Freq: Four times a day (QID) | INTRAMUSCULAR | Status: DC | PRN

## 2014-08-26 MED ORDER — PROMETHAZINE HCL 25 MG PO TABS: 25.0000 mg | ORAL_TABLET | Freq: Four times a day (QID) | ORAL | Status: DC | PRN

## 2014-08-26 MED ORDER — SODIUM CHLORIDE 0.9 % IV SOLN
INTRAVENOUS | Status: DC
  Administered 2014-08-26: 07:00:00 via INTRAVENOUS

## 2014-08-26 MED ORDER — ACETAMINOPHEN 500 MG PO TABS
1000.0000 mg | ORAL_TABLET | Freq: Once | ORAL | Status: AC
  Administered 2014-08-26: 1000 mg via ORAL
  Filled 2014-08-26: qty 2

## 2014-08-26 MED ORDER — ACETAMINOPHEN 650 MG RE SUPP
650.0000 mg | Freq: Four times a day (QID) | RECTAL | Status: DC | PRN
Start: 2014-08-26 — End: 2014-08-28

## 2014-08-26 NOTE — ED Notes (Signed)
Pt to go to Med surg bed now.  Pt flow and pt aqware

## 2014-08-26 NOTE — ED Notes (Signed)
Report called to [phone]

## 2014-08-26 NOTE — Plan of Care (Signed)
Problem: Phase III Progression Outcomes Goal: Voiding independently Outcome: Completed/Met Date Met:  08/26/14

## 2014-08-26 NOTE — ED Notes (Signed)
Pt to be admitted to Med surg bed.  Pt alert states she feels much better than this am

## 2014-08-26 NOTE — H&P (Addendum)
[facility] Admission History and Physical       Kina Murrell CBU:[id number] DOB: 01/19/1986 DOA: 08/25/2014  Referring physician: EDP PCP: Default, Provider, MD  Specialists:   Chief Complaint: Fever Headache and Neck Pain  HPI: Emily Miller is a 28 y.o. female who is [redacted] weeks pregnant who presents to the ED with complaints of severe 8/10 pounding headache in the frontal and occipital area x 24 hours.  She was seen in the ED in the AM and had an MRI of the brain which was negative for acute findings.  She was given a prescription for Vicodin which di not relieve her headache, so she returned to the ED after she had take 2 doses and also reported that she was having fever and neck pain.    She denies having any photophobia or nausea or vomiting.   A Lumbar Puncture was performed in the ED and she was placed on IV Vancomycin, Rocephin and Ampicillin and referred for medical admission.      Review of Systems:  Constitutional: No Weight Loss, No Weight Gain, Night Sweats, +Fevers, +Chills, Dizziness, Fatigue, or Generalized Weakness HEENT:  +Headaches, Difficulty Swallowing,Tooth/Dental Problems,Sore Throat,  No Sneezing, Rhinitis, Ear Ache, Nasal Congestion, or Post Nasal Drip,  Cardio-vascular:  No Chest pain, Orthopnea, PND, Edema in Lower Extremities, Anasarca, Dizziness, Palpitations  Resp: No Dyspnea, No DOE, No Productive Cough, No Non-Productive Cough, No Hemoptysis, No Wheezing.    GI: No Heartburn, Indigestion, Abdominal Pain, Nausea, Vomiting, Diarrhea, Hematemesis, Hematochezia, Melena, Change in Bowel Habits,  Loss of Appetite  GU: No Dysuria, Change in Color of Urine, No Urgency or Frequency, No Flank pain.  Musculoskeletal: No Joint Pain or Swelling, No Decreased Range of Motion, No Back Pain.  Neurologic: No Syncope, No Seizures, Muscle Weakness, Paresthesia, Vision Disturbance or Loss, No Diplopia, No Vertigo, No Difficulty Walking,  Skin: No Rash or  Lesions. Psych: No Change in Mood or Affect, No Depression or Anxiety, No Memory loss, No Confusion, or Hallucinations   Past Medical History  Diagnosis Date  . Pregnant   . Asthma       Past Surgical History  Procedure Laterality Date  . Tonsilectomy/adenoidectomy with myringotomy         Prior to Admission medications   Medication Sig Start Date End Date Taking? Authorizing Provider  albuterol (PROVENTIL HFA;VENTOLIN HFA) 108 (90 BASE) MCG/ACT inhaler Inhale 1 puff into the lungs every 6 (six) hours as needed for wheezing or shortness of breath.    Yes Historical Provider, MD  Ascorbic Acid (VITAMIN C PO) Take 1 tablet by mouth daily.   Yes Historical Provider, MD  Cholecalciferol (VITAMIN D PO) Take 1 tablet by mouth daily.   Yes Historical Provider, MD  [facility] Liver Oil (COD LIVER PO) Take 5 mLs by mouth daily.   Yes Historical Provider, MD  Prenatal Vit-Fe Fumarate-FA (MULTIVITAMIN-PRENATAL) 27-0.8 MG TABS Take 1 tablet by mouth daily.   Yes Historical Provider, MD  diphenhydrAMINE (BENADRYL) 25 mg capsule Take 1 capsule (25 mg total) by mouth every 6 (six) hours as needed for itching or allergies. Patient not taking: Reported on 08/25/2014 08/25/12   Warrick Parisian, MD  HYDROcodone-acetaminophen (NORCO/VICODIN) 5-325 MG per tablet Take 2 tablets by mouth every 4 (four) hours as needed. 08/25/14   Vida Roller, MD  oxyCODONE-acetaminophen (PERCOCET) 5-325 MG per tablet Take 2 tablets by mouth every 4 (four) hours as needed. 08/25/14   Donnetta Hutching, MD  oxyCODONE-acetaminophen (PERCOCET) 5-325 MG per  tablet Take 2 tablets by mouth every 4 (four) hours as needed. 08/26/14   Donnetta Hutching, MD  promethazine (PHENERGAN) 25 MG tablet Take 1 tablet (25 mg total) by mouth every 6 (six) hours as needed for nausea. 08/25/14   Donnetta Hutching, MD  promethazine (PHENERGAN) 25 MG tablet Take 1 tablet (25 mg total) by mouth every 6 (six) hours as needed for nausea. 08/26/14   Donnetta Hutching, MD      No  Known Allergies   Social History:  reports that she has never smoked. She does not have any smokeless tobacco history on file. She reports that she does not drink alcohol. Her drug history is not on file.     History reviewed. No pertinent family history.     Physical Exam:  GEN:  Pleasant Pregnant  28 y.o. Caucasian female examined and in no acute distress; cooperative with exam Filed Vitals:   08/25/14 2315 08/26/14 0000 08/26/14 0045 08/26/14 0154  BP: 107/63 96/56 98/54  99/51  Pulse: 95 96 98 107  Temp:    100.2 F (37.9 C)  TempSrc:    Oral  Resp: 15 17 16 20   SpO2: 100% 100% 100% 100%   Blood pressure 99/51, pulse 107, temperature 100.2 F (37.9 C), temperature source Oral, resp. rate 20, SpO2 100 %. PSYCH: She is alert and oriented x4; does not appear anxious does not appear depressed; affect is normal HEENT: Normocephalic and Atraumatic, Mucous membranes pink; PERRLA; EOM intact; Fundi:  Benign;  No scleral icterus, Nares: Patent, Oropharynx: Clear, Fair Dentition,    Neck:  FROM, No Cervical Lymphadenopathy nor Thyromegaly or Carotid Bruit; No JVD; Breasts:: Not examined CHEST WALL: No tenderness CHEST: Normal respiration, clear to auscultation bilaterally HEART: Regular rate and rhythm; no murmurs rubs or gallops BACK: No kyphosis or scoliosis; No CVA tenderness ABDOMEN: Positive Bowel Sounds, Pregnant Soft Non-Tender; No Masses, No Organomegaly. Rectal Exam: Not done EXTREMITIES: No Cyanosis, Clubbing, or Edema; No Ulcerations. Genitalia: not examined PULSES: 2+ and symmetric SKIN: Normal hydration no rash or ulceration CNS:  Alert and Oriented x 4, No focal Deficits  Vascular: pulses palpable throughout    Labs on Admission:  Basic Metabolic Panel:  Recent Labs Lab 08/26/14 0008  NA 134*  K 3.4*  CL 105  CO2 16*  GLUCOSE 97  BUN 4*  CREATININE 0.53  CALCIUM 8.0*   Liver Function Tests: No results for input(s): AST, ALT, ALKPHOS, BILITOT, PROT,  ALBUMIN in the last 168 hours. No results for input(s): LIPASE, AMYLASE in the last 168 hours. No results for input(s): AMMONIA in the last 168 hours. CBC:  Recent Labs Lab 08/26/14 0008  WBC 8.8  NEUTROABS 6.5  HGB 10.5*  HCT 32.9*  MCV 94.5  PLT 204   Cardiac Enzymes: No results for input(s): CKTOTAL, CKMB, CKMBINDEX, TROPONINI in the last 168 hours.  BNP (last 3 results) No results for input(s): PROBNP in the last 8760 hours. CBG: No results for input(s): GLUCAP in the last 168 hours.  Radiological Exams on Admission: Mr Brain Wo Contrast  08/25/2014   CLINICAL DATA:  Severe headaches since yesterday. Second-trimester pregnancy (19 weeks)  EXAM: MRI HEAD WITHOUT CONTRAST  MRV HEAD WITHOUT CONTRAST  TECHNIQUE: Multiplanar, multiecho pulse sequences of the brain and surrounding structures were obtained. Angiographic images of the intracranial venous structures were obtained using MRV technique without intravenous contrast.  COMPARISON:  None.  : FINDINGS:  The brain has a normal appearance on all pulse sequences without  evidence of malformation, atrophy, old or acute infarction, mass lesion, hemorrhage, hydrocephalus or extra-axial collection. No pituitary mass. No fluid in the sinuses, middle ears or mastoids. No skull or skullbase lesion. There is flow in the major vessels at the base of the brain. Major venous sinuses show flow.  MR venography shows normal appearance of the sinuses and deep venous system. No sign of venous thrombosis.  IMPRESSION: Normal MRI of the brain.  Normal intracranial MR venography.   Electronically Signed   By: Paulina Fusi M.D.   On: 08/25/2014 10:19   Mr Alexandria Lodge  08/25/2014   CLINICAL DATA:  Severe headaches since yesterday. Second-trimester pregnancy (19 weeks)  EXAM: MRI HEAD WITHOUT CONTRAST  MRV HEAD WITHOUT CONTRAST  TECHNIQUE: Multiplanar, multiecho pulse sequences of the brain and surrounding structures were obtained. Angiographic images of  the intracranial venous structures were obtained using MRV technique without intravenous contrast.  COMPARISON:  None.  : FINDINGS:  The brain has a normal appearance on all pulse sequences without evidence of malformation, atrophy, old or acute infarction, mass lesion, hemorrhage, hydrocephalus or extra-axial collection. No pituitary mass. No fluid in the sinuses, middle ears or mastoids. No skull or skullbase lesion. There is flow in the major vessels at the base of the brain. Major venous sinuses show flow.  MR venography shows normal appearance of the sinuses and deep venous system. No sign of venous thrombosis.  IMPRESSION: Normal MRI of the brain.  Normal intracranial MR venography.   Electronically Signed   By: Paulina Fusi M.D.   On: 08/25/2014 10:19       Assessment/Plan:   28 y.o. female with   Principal Problem:   1.   Meningitis- bacterial , CSF Fluid with High WBCs/Neutrophils.    Lumbar Puncture performed, Studies sent  IV Vancomycin, Rocephin, and Ampicillin  Droplet Precautions  Consider ID consult    Active Problems:   2.   Pregnant  At [redacted] Weeks      3.   Asthma  Stable  Albuterol MDI QID PRN     4.   Anemia  Send Anemia Panel     5.   DVT Prophylaxis  SCDs      Code Status:     FULL CODE Family Communication:   No Family Present Disposition Plan:     Inpatient  Time spent:  32 Minutes  Ron Parker [facility] Pager [phone]   If 7AM -7PM Please Contact the Day Rounding Team MD for [facility]  If 7PM-7AM, Please Contact Night-Floor Coverage  [email or web address] Password [email or web address] 08/26/2014, 3:08 AM

## 2014-08-26 NOTE — ED Notes (Signed)
CRITICAL VALUE ALERT  Critical value received:  0145  Date of notification:  08/26/14  Time of notification:  0145  Critical value read back:Yes.    Nurse who received alert:  Liz Beach, RN  MD notified (1st page):  Norlene Campbell, MD  Time of first page:  0145  Responding MD:  Norlene Campbell, MD  Time MD responded:  [phone]

## 2014-08-26 NOTE — ED Notes (Signed)
amb to BR without problem

## 2014-08-26 NOTE — Progress Notes (Signed)
ANTIBIOTIC CONSULT NOTE - INITIAL  Pharmacy Consult for Vancomycin  Indication: rule out meningitis  No Known Allergies  Weight: 94 kg (please note pt is currently [redacted] weeks pregnant)  Vital Signs: Temp: 100.2 F (37.9 C) (11/15 0154) Temp Source: Oral (11/15 0154) BP: 99/51 mmHg (11/15 0154) Pulse Rate: 107 (11/15 0154)  Labs:  Recent Labs  08/26/14 0008  WBC 8.8  HGB 10.5*  PLT 204  CREATININE 0.53    Medical History: Past Medical History  Diagnosis Date  . Pregnant   . Asthma     Assessment: 28 y/o F with HA/fever, starting antibiotics for meningitis coverge, WBC WNL, renal function is good, pt is pregnant.    Goal of Therapy:  Vancomycin trough level 15-20 mcg/ml  Plan:  -Vancomycin 1000 mg IV q8h -Ceftriaxone 2g IV q12h per MD -Ampicillin 2g IV q4h per MD -Trend WBC, temp, renal function  -F/U LP results -Drug levels as indicated  Abran Duke 08/26/2014,3:12 AM

## 2014-08-26 NOTE — ED Provider Notes (Signed)
Care assumed from Dr. Adriana Simas at change of shift.patient is a 28 year old female, otherwise healthy at [redacted] weeks pregnant who presented earlier today with a headache, had negative workup including MRI.  Patient returned when headache persisted.while being evaluated the second time, it was noted that patient had fever.  LP was completed.  Patient was awaiting results of LP  Results for orders placed or performed during the hospital encounter of 08/25/14  Rapid strep screen  Result Value Ref Range   Streptococcus, Group A Screen (Direct) NEGATIVE NEGATIVE  Gram stain  Result Value Ref Range   Specimen Description CSF    Special Requests Normal    Gram Stain      CYTOSPUN WBC PRESENT,BOTH PMN AND MONONUCLEAR NO ORGANISMS SEEN    Report Status 08/26/2014 FINAL   Basic metabolic panel  Result Value Ref Range   Sodium 134 (L) 137 - 147 mEq/L   Potassium 3.4 (L) 3.7 - 5.3 mEq/L   Chloride 105 96 - 112 mEq/L   CO2 16 (L) 19 - 32 mEq/L   Glucose, Bld 97 70 - 99 mg/dL   BUN 4 (L) 6 - 23 mg/dL   Creatinine, Ser 0.96 0.50 - 1.10 mg/dL   Calcium 8.0 (L) 8.4 - 10.5 mg/dL   GFR calc non Af Amer >90 >90 mL/min   GFR calc Af Amer >90 >90 mL/min   Anion gap 13 5 - 15  CBC with Differential  Result Value Ref Range   WBC 8.8 4.0 - 10.5 K/uL   RBC 3.48 (L) 3.87 - 5.11 MIL/uL   Hemoglobin 10.5 (L) 12.0 - 15.0 g/dL   HCT 28.3 (L) 66.2 - 94.7 %   MCV 94.5 78.0 - 100.0 fL   MCH 30.2 26.0 - 34.0 pg   MCHC 31.9 30.0 - 36.0 g/dL   RDW 65.4 65.0 - 35.4 %   Platelets 204 150 - 400 K/uL   Neutrophils Relative % 74 43 - 77 %   Neutro Abs 6.5 1.7 - 7.7 K/uL   Lymphocytes Relative 21 12 - 46 %   Lymphs Abs 1.8 0.7 - 4.0 K/uL   Monocytes Relative 5 3 - 12 %   Monocytes Absolute 0.5 0.1 - 1.0 K/uL   Eosinophils Relative 0 0 - 5 %   Eosinophils Absolute 0.0 0.0 - 0.7 K/uL   Basophils Relative 0 0 - 1 %   Basophils Absolute 0.0 0.0 - 0.1 K/uL  Mononucleosis screen  Result Value Ref Range   Mono Screen  NEGATIVE NEGATIVE  CSF cell count with differential collection tube #: 1  Result Value Ref Range   Tube # 1    Color, CSF COLORLESS COLORLESS   Appearance, CSF CLEAR CLEAR   Supernatant NOT INDICATED    RBC Count, CSF 58 (H) 0 /cu mm   WBC, CSF 224 (HH) 0 - 5 /cu mm   Segmented Neutrophils-CSF 86 (H) 0 - 6 %   Lymphs, CSF 5 (L) 40 - 80 %   Monocyte-Macrophage-Spinal Fluid 6 (L) 15 - 45 %   Eosinophils, CSF 3 (H) 0 - 1 %  CSF cell count with differential collection tube #: 4  Result Value Ref Range   Tube # 4    Color, CSF COLORLESS COLORLESS   Appearance, CSF CLEAR CLEAR   Supernatant NOT INDICATED    RBC Count, CSF 14 (H) 0 /cu mm   WBC, CSF 250 (HH) 0 - 5 /cu mm   Segmented Neutrophils-CSF 86 (H)  0 - 6 %   Lymphs, CSF 2 (L) 40 - 80 %   Monocyte-Macrophage-Spinal Fluid 8 (L) 15 - 45 %   Eosinophils, CSF 4 (H) 0 - 1 %  Glucose, CSF  Result Value Ref Range   Glucose, CSF 57 43 - 76 mg/dL  Protein, CSF  Result Value Ref Range   Total  Protein, CSF 47 (H) 15 - 45 mg/dL   Mr Brain Wo Contrast  08/25/2014   CLINICAL DATA:  Severe headaches since yesterday. Second-trimester pregnancy (19 weeks)  EXAM: MRI HEAD WITHOUT CONTRAST  MRV HEAD WITHOUT CONTRAST  TECHNIQUE: Multiplanar, multiecho pulse sequences of the brain and surrounding structures were obtained. Angiographic images of the intracranial venous structures were obtained using MRV technique without intravenous contrast.  COMPARISON:  None.  : FINDINGS:  The brain has a normal appearance on all pulse sequences without evidence of malformation, atrophy, old or acute infarction, mass lesion, hemorrhage, hydrocephalus or extra-axial collection. No pituitary mass. No fluid in the sinuses, middle ears or mastoids. No skull or skullbase lesion. There is flow in the major vessels at the base of the brain. Major venous sinuses show flow.  MR venography shows normal appearance of the sinuses and deep venous system. No sign of venous  thrombosis.  IMPRESSION: Normal MRI of the brain.  Normal intracranial MR venography.   Electronically Signed   By: Paulina Fusi M.D.   On: 08/25/2014 10:19   Mr Alexandria Lodge  08/25/2014   CLINICAL DATA:  Severe headaches since yesterday. Second-trimester pregnancy (19 weeks)  EXAM: MRI HEAD WITHOUT CONTRAST  MRV HEAD WITHOUT CONTRAST  TECHNIQUE: Multiplanar, multiecho pulse sequences of the brain and surrounding structures were obtained. Angiographic images of the intracranial venous structures were obtained using MRV technique without intravenous contrast.  COMPARISON:  None.  : FINDINGS:  The brain has a normal appearance on all pulse sequences without evidence of malformation, atrophy, old or acute infarction, mass lesion, hemorrhage, hydrocephalus or extra-axial collection. No pituitary mass. No fluid in the sinuses, middle ears or mastoids. No skull or skullbase lesion. There is flow in the major vessels at the base of the brain. Major venous sinuses show flow.  MR venography shows normal appearance of the sinuses and deep venous system. No sign of venous thrombosis.  IMPRESSION: Normal MRI of the brain.  Normal intracranial MR venography.   Electronically Signed   By: Paulina Fusi M.D.   On: 08/25/2014 10:19    Patient with elevated white blood cells noted in CSF.  Patient reassessed.  She is again febrile.  Tylenol and IV fluids ordered.  Case discussed with Dr. Lovell Sheehan with hospitalist for admission.  Patient updated on findings and plan for coverage of possible bacterial meningitis with thank Rocephin and ampicillin to cover for listeria.  Marland Kitchendx  Olivia Mackie, MD 08/26/14 [phone]

## 2014-08-26 NOTE — ED Notes (Signed)
Paged Dr. Lendell Caprice for pt.  Pt concerned about other children at home and her pediatriacain would like to speak with admiting MD about possible treatment of family members

## 2014-08-26 NOTE — ED Notes (Signed)
Dr Joretta Bachelor st bedside

## 2014-08-26 NOTE — Consult Note (Signed)
    [facility]     Reason for Consult: meningitis    Referring Physician: Dr. Lendell Caprice  Principal Problem:   Meningitis Active Problems:   Pregnant   Asthma   Anemia   . cefTRIAXone (ROCEPHIN)  IV  2 g Intravenous Q12H  . cholecalciferol  1,000 Units Oral Daily  . multivitamin-prenatal  1 tablet Oral Daily  . vitamin C  500 mg Oral Daily    Recommendations: Can d/c isolation, negative gram stain Will narrow to ceftriaxone,  No risk factors for listeria  Assessment: She has symptoms of viral meningitis with headache and some neck stiffness with CSF findings of normal glucose and near normal protein c/w viral origin.  Gram stain negative for diplococcus so no indication for isolation.  No acutely ill so no indication for steroids or resistant coverage with vancomycin.    Antibiotics: Vanco/cef/amp  HPI: Emily Miller is a 28 y.o. female [redacted] weeks pregnant presented to ED with severe headache for 24 hours.  MRI done and negative for acute issue.  LP done with 250 WBCs, neutrophil predominance with some eosinophils as well (< 10) but with normal glucose, protein and Tmax of 100.2. + sick contacts with viral etiologies in family.  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  No recent travel.    Review of Systems: A comprehensive review of systems was negative.  Past Medical History  Diagnosis Date  . Pregnant   . Asthma     History  Substance Use Topics  . Smoking status: Never Smoker   . Smokeless tobacco: Not on file  . Alcohol Use: No    History reviewed. No pertinent family history. No Known Allergies  OBJECTIVE: Blood pressure 108/61, pulse 85, temperature 98.2 F (36.8 C), temperature source Oral, resp. rate 17, height 5\' 9"  (1.753 m), weight 217 lb 9.5 oz (98.7 kg), SpO2 100 %. General: awake, alert, nad Skin: no rashes Lungs: CTA B Cor: RRR Abdomen: gravid Ext: no  edema  Microbiology: Recent Results (from the past 240 hour(s))  Gram stain     Status: None   Collection Time: 08/25/14 11:54 PM  Result Value Ref Range Status   Specimen Description CSF  Final   Special Requests Normal  Final   Gram Stain   Final    CYTOSPUN WBC PRESENT,BOTH PMN AND MONONUCLEAR NO ORGANISMS SEEN    Report Status 08/26/2014 FINAL  Final  Rapid strep screen     Status: None   Collection Time: 08/26/14 12:29 AM  Result Value Ref Range Status   Streptococcus, Group A Screen (Direct) NEGATIVE NEGATIVE Final    Comment: (NOTE) A Rapid Antigen test may result negative if the antigen level in the sample is below the detection level of this test. The FDA has not cleared this test as a stand-alone test therefore the rapid antigen negative result has reflexed to a Group A Strep culture.     08/28/14, MD [email or web address] Staci Righter pager  [phone] cell 08/26/2014, 1:40 PM

## 2014-08-26 NOTE — ED Notes (Signed)
Pt calling her children's pediatrician to evaluate need for treatment

## 2014-08-26 NOTE — Progress Notes (Signed)
Emily Miller [id number] Admission Data: 08/26/2014 1:37 PM Attending Provider: Christiane Ha, MD  NVB:[id number], Provider, MD Consults/ Treatment Team:    Emily Miller is a 28 y.o. female patient admitted from ED awake, alert  & orientated  X 3,  Full Code, VSS - Blood pressure 108/61, pulse 85, temperature 98.2 F (36.8 C), temperature source Oral, resp. rate 17, height 5\' 9"  (1.753 m), weight 98.7 kg (217 lb 9.5 oz), SpO2 100 %., no c/o shortness of breath, no c/o chest pain, no distress noted.    IV site WDL:  Right A/C running abts.   Allergies:  No Known Allergies   Past Medical History  Diagnosis Date  . Pregnant   . Asthma       Pt orientation to unit, room and routine. Information packet given to patient/family and safety video watched.  Admission INP armband ID verified with patient/family, and in place. SR up x 2, fall risk assessment complete with Patient and family verbalizing understanding of risks associated with falls. Pt verbalizes an understanding of how to use the call bell and to call for help before getting out of bed.  Skin, clean-dry- intact without evidence of bruising, or skin tears.   No evidence of skin break down noted on exam.     Will cont to monitor and assist as needed.  , RN 08/26/2014 1:37 PM

## 2014-08-26 NOTE — ED Notes (Signed)
Breakfast tray given. °

## 2014-08-26 NOTE — ED Notes (Signed)
Dr. Lendell Caprice will see pt in ED and call pediatrician with pt

## 2014-08-26 NOTE — Progress Notes (Signed)
Patient admitted after midnight.  Chart reviewed. Patient examined. Normal CSF glucose and only minimally elevated CSF protein.  Gram stain without organisms. VSS and no nuchal rigidity. Could be viral meningitis. Discussed with Dr. Luciana Axe, who agrees and will consult.  Stable to go to floor. Reasonable to admit and continue empiric abx for now. Patient's gynecologist is in [facility]. Dr. Halina Andreas. Had negative HIV a few weeks ago as part of prenatal screen.  Discussed with Dr. Rollen Sox, on call for OB. She recommends complete OB US, LFTs and records and will follow peripherally. [facility] without any record. Will obtain from Dr. Jilda Panda office Monday.  Crista Curb, MD [facility] [phone]

## 2014-08-27 DIAGNOSIS — J45909 Unspecified asthma, uncomplicated: Secondary | ICD-10-CM

## 2014-08-27 DIAGNOSIS — G030 Nonpyogenic meningitis: Secondary | ICD-10-CM

## 2014-08-27 MED ORDER — SODIUM CHLORIDE 0.9 % IJ SOLN
3.0000 mL | Freq: Two times a day (BID) | INTRAMUSCULAR | Status: DC
  Administered 2014-08-27: 3 mL via INTRAVENOUS

## 2014-08-27 MED ORDER — SODIUM CHLORIDE 0.9 % IV SOLN: 250.0000 mL | INTRAVENOUS | Status: DC | PRN

## 2014-08-27 MED ORDER — SODIUM CHLORIDE 0.9 % IJ SOLN: 3.0000 mL | INTRAMUSCULAR | Status: DC | PRN

## 2014-08-27 NOTE — Progress Notes (Signed)
Utilization review complete. Alesia Shavis RN CCM Case Mgmt phone [phone] 

## 2014-08-27 NOTE — Plan of Care (Signed)
Problem: Phase I Progression Outcomes Goal: Pain controlled with appropriate interventions Outcome: Completed/Met Date Met:  08/27/14 Goal: OOB as tolerated unless otherwise ordered Outcome: Completed/Met Date Met:  08/27/14 Goal: Voiding-avoid urinary catheter unless indicated Outcome: Completed/Met Date Met:  08/27/14 Goal: Other Phase I Outcomes/Goals Outcome: Not Applicable Date Met:  07/57/32  Problem: Phase II Progression Outcomes Goal: Progress activity as tolerated unless otherwise ordered Outcome: Completed/Met Date Met:  08/27/14 Goal: Obtain order to discontinue catheter if appropriate Outcome: Not Applicable Date Met:  25/67/20 Goal: Other Phase II Outcomes/Goals Outcome: Not Applicable Date Met:  91/98/02  Problem: Phase III Progression Outcomes Goal: Foley discontinued Outcome: Not Applicable Date Met:  21/79/81 Goal: Other Phase III Outcomes/Goals Outcome: Not Applicable Date Met:  02/54/86  Problem: Discharge Progression Outcomes Goal: Pain controlled with appropriate interventions Outcome: Completed/Met Date Met:  08/27/14 Goal: Tolerating diet Outcome: Completed/Met Date Met:  08/27/14 Goal: Other Discharge Outcomes/Goals Outcome: Not Applicable Date Met:  28/24/17

## 2014-08-27 NOTE — Progress Notes (Signed)
[facility] PROGRESS NOTE  Emily Miller GHW:[id number] DOB: 1986-04-20 DOA: 08/25/2014 PCP: Default, Provider, MD  Assessment/Plan:  Principal Problem:   Meningitis, likely viral, but remains on IV rocephin. Home tomorrow (or sooner) if ok with ID. Appreciate their assistance Active Problems:   Pregnant: [place], LFTs ok. No records from Dr. Shawnie Pons yet.   Asthma   Anemia  HPI/Subjective: Headache 1-2/10. Requiring only tylenol  Objective: Filed Vitals:   08/27/14 0809  BP: 125/82  Pulse: 80  Temp: 98.4 F (36.9 C)  Resp: 16    Intake/Output Summary (Last 24 hours) at 08/27/14 1338 Last data filed at 08/27/14 0601  Gross per 24 hour  Intake 1116.34 ml  Output      3 ml  Net 1113.34 ml   Filed Weights   08/26/14 1305  Weight: 98.7 kg (217 lb 9.5 oz)    Exam:   General:  Asleep. Arousable. Oriented and appropriate  HEENT: neck supple  Cardiovascular: RRR without MGR  Respiratory: CTA without WRR  Abdomen: S, NT, ND  Ext: no CCE  Basic Metabolic Panel:  Recent Labs Lab 08/26/14 0008 08/26/14 0622  NA 134* 136*  K 3.4* 3.7  CL 105 107  CO2 16* 18*  GLUCOSE 97 93  BUN 4* 4*  CREATININE 0.53 0.49*  CALCIUM 8.0* 7.8*   Liver Function Tests:  Recent Labs Lab 08/26/14 1232  AST 12  ALT 11  ALKPHOS 53  BILITOT <0.2*  PROT 5.8*  ALBUMIN 2.5*   No results for input(s): LIPASE, AMYLASE in the last 168 hours. No results for input(s): AMMONIA in the last 168 hours. CBC:  Recent Labs Lab 08/26/14 0008 08/26/14 0622  WBC 8.8 8.5  NEUTROABS 6.5  --   HGB 10.5* 11.5*  HCT 32.9* 34.2*  MCV 94.5 91.4  PLT 204 202   Cardiac Enzymes: No results for input(s): CKTOTAL, CKMB, CKMBINDEX, TROPONINI in the last 168 hours. BNP (last 3 results) No results for input(s): PROBNP in the last 8760 hours. CBG: No results for input(s): GLUCAP in the last 168 hours.  Recent Results (from the past 240 hour(s))  CSF culture     Status: None  (Preliminary result)   Collection Time: 08/25/14 11:54 PM  Result Value Ref Range Status   Specimen Description CSF  Final   Special Requests Normal  Final   Gram Stain   Final    CYTOSPIN SLIDE WBC PRESENT,BOTH PMN AND MONONUCLEAR NO ORGANISMS SEEN Performed at [facility] Performed at [facility]    Culture NO GROWTH Performed at [facility]   Final   Report Status PENDING  Incomplete  Gram stain     Status: None   Collection Time: 08/25/14 11:54 PM  Result Value Ref Range Status   Specimen Description CSF  Final   Special Requests Normal  Final   Gram Stain   Final    CYTOSPUN WBC PRESENT,BOTH PMN AND MONONUCLEAR NO ORGANISMS SEEN    Report Status 08/26/2014 FINAL  Final  Rapid strep screen     Status: None   Collection Time: 08/26/14 12:29 AM  Result Value Ref Range Status   Streptococcus, Group A Screen (Direct) NEGATIVE NEGATIVE Final    Comment: (NOTE) A Rapid Antigen test may result negative if the antigen level in the sample is below the detection level of this test. The FDA has not cleared this test as a stand-alone test therefore the rapid antigen negative result has reflexed to a Group  A Strep culture.      Studies: US Ob Limited  08/26/2014   CLINICAL DATA:  28 year old pregnant female with severe headache. Evaluate pregnancy.  EXAM: LIMITED OBSTETRIC ULTRASOUND  FINDINGS: Number of Fetuses: 1  Heart Rate:  160 bpm  Movement: Yes  Presentation: Cephalic  Placental Location: Posterior  Previa: No  Amniotic Fluid (Subjective):  Normal  BPD:  4.5Cm 19w  5d  MATERNAL FINDINGS:  Cervix:  Appears closed and measures 3.5 cm.  Uterus/Adnexae: The ovaries bilaterally are not visualized. There is no evidence of adnexal mass or free fluid.  IMPRESSION: Single living intrauterine gestation in cephalic presentation. Estimated gestational age of [redacted] weeks 5 days by this ultrasound, but correlate with any prior imaging.  No significant abnormalities  identified.  This exam is performed on an emergent basis and does not comprehensively evaluate fetal size, dating, or anatomy; follow-up complete OB US should be considered if further fetal assessment is warranted.   Electronically Signed   By: Laveda Abbe M.D.   On: 08/26/2014 16:55    Scheduled Meds: . cefTRIAXone (ROCEPHIN)  IV  2 g Intravenous Q12H  . cholecalciferol  1,000 Units Oral Daily  . multivitamin-prenatal  1 tablet Oral Daily  . vitamin C  500 mg Oral Daily   Continuous Infusions: . sodium chloride 10 mL/hr at 08/27/14 0601    Time spent: 15 minutes  [name],[name]  [facility] Pager [phone]. If 7PM-7AM, please contact night-coverage at [email or web address], password [facility] 08/27/2014, 1:38 PM  LOS: 2 days

## 2014-08-27 NOTE — Progress Notes (Signed)
[facility]    Date of Admission:  08/25/2014   Total days of antibiotics 3        Day 3 ceftriaxone           ID: Emily Miller is a 28 y.o. female with G3P2 at [redacted] wk gestation presented with HA, neck pain, found to have aseptic meningitis, likely viral meningitis Principal Problem:   Meningitis Active Problems:   Pregnant   Asthma   Anemia    Subjective: Remains afebrile, headache and neck pain much improve  Medications:  . cholecalciferol  1,000 Units Oral Daily  . multivitamin-prenatal  1 tablet Oral Daily  . sodium chloride  3 mL Intravenous Q12H  . vitamin C  500 mg Oral Daily    Objective: Vital signs in last 24 hours: Temp:  [97.4 F (36.3 C)-99.1 F (37.3 C)] 97.4 F (36.3 C) (11/16 1424) Pulse Rate:  [75-86] 86 (11/16 1424) Resp:  [16-20] 20 (11/16 1424) BP: (95-125)/(46-82) 117/66 mmHg (11/16 1424) SpO2:  [99 %-100 %] 100 % (11/16 1424) Physical Exam  Constitutional:  oriented to person, place, and time. appears well-developed and well-nourished. No distress.  HENT:  Mouth/Throat: Oropharynx is clear and moist. No oropharyngeal exudate.  Cardiovascular: Normal rate, regular rhythm and normal heart sounds. Exam reveals no gallop and no friction rub.  No murmur heard.  Pulmonary/Chest: Effort normal and breath sounds normal. No respiratory distress.  has no wheezes.  Lymphadenopathy: no cervical adenopathy.  Neurological: alert and oriented to person, place, and time.  Skin: Skin is warm and dry. No rash noted. No erythema.  Psychiatric: a normal mood and affect.  behavior is normal.    Lab Results  Recent Labs  08/26/14 0008 08/26/14 0622  WBC 8.8 8.5  HGB 10.5* 11.5*  HCT 32.9* 34.2*  NA 134* 136*  K 3.4* 3.7  CL 105 107  CO2 16* 18*  BUN 4* 4*  CREATININE 0.53 0.49*   Liver Panel  Recent Labs  08/26/14 1232  PROT 5.8*  ALBUMIN 2.5*  AST 12  ALT 11  ALKPHOS 53  BILITOT <0.2*  BILIDIR <0.2  IBILI  NOT CALCULATED    Microbiology: 11/14 csf cx ngtd at 48hr Studies/Results: US Ob Limited  08/26/2014   CLINICAL DATA:  28 year old pregnant female with severe headache. Evaluate pregnancy.  EXAM: LIMITED OBSTETRIC ULTRASOUND  FINDINGS: Number of Fetuses: 1  Heart Rate:  160 bpm  Movement: Yes  Presentation: Cephalic  Placental Location: Posterior  Previa: No  Amniotic Fluid (Subjective):  Normal  BPD:  4.5Cm 19w  5d  MATERNAL FINDINGS:  Cervix:  Appears closed and measures 3.5 cm.  Uterus/Adnexae: The ovaries bilaterally are not visualized. There is no evidence of adnexal mass or free fluid.  IMPRESSION: Single living intrauterine gestation in cephalic presentation. Estimated gestational age of [redacted] weeks 5 days by this ultrasound, but correlate with any prior imaging.  No significant abnormalities identified.  This exam is performed on an emergent basis and does not comprehensively evaluate fetal size, dating, or anatomy; follow-up complete OB US should be considered if further fetal assessment is warranted.   Electronically Signed   By: Laveda Abbe M.D.   On: 08/26/2014 16:55    Assessment/Plan: Aseptic meningitis = gram stain negative, no growth at 48hrs. Can discontinue all antibiotics and watch over the next 24hr. In anticipation to go home. Appears back to her baseline. Unlikely to be HSV or VZV since patient is improved  despite no exposure to antivirals  Pregnancy = recommend hiv testing, ensure she has received flu vaccination  [name] Cell: [phone] Pager: [phone]  08/27/2014, 8:24 PM

## 2014-08-28 ENCOUNTER — Encounter (HOSPITAL_COMMUNITY): Payer: Self-pay | Admitting: Surgery

## 2014-08-28 LAB — PATHOLOGIST SMEAR REVIEW

## 2014-08-28 LAB — HIV ANTIBODY (ROUTINE TESTING W REFLEX): HIV 1&2 Ab, 4th Generation: NONREACTIVE

## 2014-08-28 LAB — CULTURE, GROUP A STREP

## 2014-08-28 MED ORDER — ACETAMINOPHEN 325 MG PO TABS: 650.0000 mg | ORAL_TABLET | Freq: Four times a day (QID) | ORAL | Status: DC | PRN

## 2014-08-28 MED ORDER — HYDROCODONE-ACETAMINOPHEN 5-325 MG PO TABS: 1.0000 | ORAL_TABLET | Freq: Four times a day (QID) | ORAL | Status: DC | PRN

## 2014-08-28 NOTE — Discharge Summary (Signed)
Physician Discharge Summary  Emily Miller [id number]:[id number] DOB: [birth year] DOA: 08/25/2014  PCP: Default, Provider, MD  Admit date: 08/25/2014 Discharge date: 08/28/2014  Time spent: greater than 30 mintutes  Discharge Diagnoses:  Principal Problem:   Meningitis, viral Active Problems:   Pregnant   Asthma   Anemia   Discharge Condition: stable  Filed Weights   08/26/14 1305  Weight: 98.7 kg (217 lb 9.5 oz)    History of present illness:  28 y.o. female who is [redacted] weeks pregnant who presents to the ED with complaints of severe 8/10 pounding headache in the frontal and occipital area x 24 hours. She was seen in the ED in the AM and had an MRI MRV of the brain which was negative for acute findings. She was given a prescription for Vicodin which di not relieve her headache, so she returned to the ED after she had take 2 doses and also reported that she was having fever and neck pain. She denies having any photophobia or nausea or vomiting. A Lumbar Puncture was performed in the ED and she was placed on IV Vancomycin, Rocephin and Ampicillin and referred for medical admission.  Hospital Course:  CSF findings showed WBC count of about 250, normal glucose and marginally elevated protein, more consistent with viral meningitis.  No meningismus or confusion.  ID consulted and stopped vancomycin and ampicillin.  Recommended observation on rocephin for 48 hours, then discharge if stable.  Cultures remained negative. Symptoms improved. Vitals stable by discharge.   Discussed with Dr. Rollen Sox via phone who recommended ultrasound which showed nothing concerning, and LFTs, which were normal.  Patient has f/u with her OB, in [facility]. Records from the office requested, but never received.  Procedures:  LP  Consultations:  ID  Discharge Exam: Filed Vitals:   08/28/14 0702  BP: 106/52  Pulse: 86  Temp: 98.3 F (36.8 C)  Resp: 17    General: nontoxic. Comfortable, a and  o Neck: supple Cardiovascular: RRR Respiratory: CTA Abd: gravid Ext no CCE  Discharge Instructions    Call MD for:  persistant nausea and vomiting    Complete by:  As directed      Call MD for:  severe uncontrolled pain    Complete by:  As directed      Diet general    Complete by:  As directed      Increase activity slowly    Complete by:  As directed           Current Discharge Medication List    START taking these medications   Details  acetaminophen (TYLENOL) 325 MG tablet Take 2 tablets (650 mg total) by mouth every 6 (six) hours as needed for fever (or fever).      CONTINUE these medications which have CHANGED   Details  HYDROcodone-acetaminophen (NORCO/VICODIN) 5-325 MG per tablet Take 1 tablet by mouth every 6 (six) hours as needed. Qty: 10 tablet, Refills: 0      CONTINUE these medications which have NOT CHANGED   Details  albuterol (PROVENTIL HFA;VENTOLIN HFA) 108 (90 BASE) MCG/ACT inhaler Inhale 1 puff into the lungs every 6 (six) hours as needed for wheezing or shortness of breath.     Ascorbic Acid (VITAMIN C PO) Take 1 tablet by mouth daily.    Cholecalciferol (VITAMIN D PO) Take 1 tablet by mouth daily.    Cod Liver Oil (COD LIVER PO) Take 5 mLs by mouth daily.    Prenatal Vit-Fe Fumarate-FA (MULTIVITAMIN-PRENATAL) 27-0.8  MG TABS Take 1 tablet by mouth daily.      STOP taking these medications     diphenhydrAMINE (BENADRYL) 25 mg capsule        No Known Allergies Follow-up Information    Follow up with primary care provider.   Why:  If symptoms worsen       The results of significant diagnostics from this hospitalization (including imaging, microbiology, ancillary and laboratory) are listed below for reference.    Significant Diagnostic Studies: Mr Brain Wo Contrast  08/25/2014   CLINICAL DATA:  Severe headaches since yesterday. Second-trimester pregnancy (19 weeks)  EXAM: MRI HEAD WITHOUT CONTRAST  MRV HEAD WITHOUT CONTRAST  TECHNIQUE:  Multiplanar, multiecho pulse sequences of the brain and surrounding structures were obtained. Angiographic images of the intracranial venous structures were obtained using MRV technique without intravenous contrast.  COMPARISON:  None.  : FINDINGS:  The brain has a normal appearance on all pulse sequences without evidence of malformation, atrophy, old or acute infarction, mass lesion, hemorrhage, hydrocephalus or extra-axial collection. No pituitary mass. No fluid in the sinuses, middle ears or mastoids. No skull or skullbase lesion. There is flow in the major vessels at the base of the brain. Major venous sinuses show flow.  MR venography shows normal appearance of the sinuses and deep venous system. No sign of venous thrombosis.  IMPRESSION: Normal MRI of the brain.  Normal intracranial MR venography.   Electronically Signed   By: Paulina Fusi M.D.   On: 08/25/2014 10:19   Mr [place]  08/25/2014   CLINICAL DATA:  Severe headaches since yesterday. Second-trimester pregnancy (19 weeks)  EXAM: MRI HEAD WITHOUT CONTRAST  MRV HEAD WITHOUT CONTRAST  TECHNIQUE: Multiplanar, multiecho pulse sequences of the brain and surrounding structures were obtained. Angiographic images of the intracranial venous structures were obtained using MRV technique without intravenous contrast.  COMPARISON:  None.  : FINDINGS:  The brain has a normal appearance on all pulse sequences without evidence of malformation, atrophy, old or acute infarction, mass lesion, hemorrhage, hydrocephalus or extra-axial collection. No pituitary mass. No fluid in the sinuses, middle ears or mastoids. No skull or skullbase lesion. There is flow in the major vessels at the base of the brain. Major venous sinuses show flow.  MR venography shows normal appearance of the sinuses and deep venous system. No sign of venous thrombosis.  IMPRESSION: Normal MRI of the brain.  Normal intracranial MR venography.   Electronically Signed   By: Paulina Fusi M.D.    On: 08/25/2014 10:19   US Ob Limited  08/26/2014   CLINICAL DATA:  28 year old pregnant female with severe headache. Evaluate pregnancy.  EXAM: LIMITED OBSTETRIC ULTRASOUND  FINDINGS: Number of Fetuses: 1  Heart Rate:  160 bpm  Movement: Yes  Presentation: Cephalic  Placental Location: Posterior  Previa: No  Amniotic Fluid (Subjective):  Normal  BPD:  4.5Cm 19w  5d  MATERNAL FINDINGS:  Cervix:  Appears closed and measures 3.5 cm.  Uterus/Adnexae: The ovaries bilaterally are not visualized. There is no evidence of adnexal mass or free fluid.  IMPRESSION: Single living intrauterine gestation in cephalic presentation. Estimated gestational age of [redacted] weeks 5 days by this ultrasound, but correlate with any prior imaging.  No significant abnormalities identified.  This exam is performed on an emergent basis and does not comprehensively evaluate fetal size, dating, or anatomy; follow-up complete OB US should be considered if further fetal assessment is warranted.   Electronically Signed   By: Trey Paula  Hu M.D.   On: 08/26/2014 16:55    Microbiology: Recent Results (from the past 240 hour(s))  CSF culture     Status: None (Preliminary result)   Collection Time: 08/25/14 11:54 PM  Result Value Ref Range Status   Specimen Description CSF  Final   Special Requests Normal  Final   Gram Stain   Final    CYTOSPIN SLIDE WBC PRESENT,BOTH PMN AND MONONUCLEAR NO ORGANISMS SEEN Performed at [facility] Performed at [facility]    Culture NO GROWTH Performed at [facility]   Final   Report Status PENDING  Incomplete  Gram stain     Status: None   Collection Time: 08/25/14 11:54 PM  Result Value Ref Range Status   Specimen Description CSF  Final   Special Requests Normal  Final   Gram Stain   Final    CYTOSPUN WBC PRESENT,BOTH PMN AND MONONUCLEAR NO ORGANISMS SEEN    Report Status 08/26/2014 FINAL  Final  Rapid strep screen     Status: None   Collection Time: 08/26/14 12:29 AM   Result Value Ref Range Status   Streptococcus, Group A Screen (Direct) NEGATIVE NEGATIVE Final    Comment: (NOTE) A Rapid Antigen test may result negative if the antigen level in the sample is below the detection level of this test. The FDA has not cleared this test as a stand-alone test therefore the rapid antigen negative result has reflexed to a Group A Strep culture.   Culture, Group A Strep     Status: None (Preliminary result)   Collection Time: 08/26/14 12:29 AM  Result Value Ref Range Status   Specimen Description THROAT  Final   Special Requests ADDED 0105  Final   Culture   Final    NO SUSPICIOUS COLONIES, CONTINUING TO HOLD Performed at [facility]    Report Status PENDING  Incomplete     Labs: Basic Metabolic Panel:  Recent Labs Lab 08/26/14 0008 08/26/14 0622  NA 134* 136*  K 3.4* 3.7  CL 105 107  CO2 16* 18*  GLUCOSE 97 93  BUN 4* 4*  CREATININE 0.53 0.49*  CALCIUM 8.0* 7.8*   Liver Function Tests:  Recent Labs Lab 08/26/14 1232  AST 12  ALT 11  ALKPHOS 53  BILITOT <0.2*  PROT 5.8*  ALBUMIN 2.5*   No results for input(s): LIPASE, AMYLASE in the last 168 hours. No results for input(s): AMMONIA in the last 168 hours. CBC:  Recent Labs Lab 08/26/14 0008 08/26/14 0622  WBC 8.8 8.5  NEUTROABS 6.5  --   HGB 10.5* 11.5*  HCT 32.9* 34.2*  MCV 94.5 91.4  PLT 204 202   Cardiac Enzymes: No results for input(s): CKTOTAL, CKMB, CKMBINDEX, TROPONINI in the last 168 hours. BNP: BNP (last 3 results) No results for input(s): PROBNP in the last 8760 hours. CBG: No results for input(s): GLUCAP in the last 168 hours.     SignedChristiane Ha  [facility] 08/28/2014, 8:40 AM

## 2014-08-28 NOTE — Progress Notes (Signed)
[facility] [address] [id number] [place] [address] Phone: [phone] Fax: [phone]  August 28, 2014  Patient: Emily Miller  Date of Birth: 30-Aug-1986  Date of Visit: 08/25/2014    To Whom It May Concern:  Saralyn Willison was seen and treated in our unit on 08/25/2014. Anyela Napierkowski  may return to work on 09/03/14.  Sincerely,      Crista Curb, MD [facility]

## 2014-08-30 LAB — CSF CULTURE

## 2014-08-30 LAB — CSF CULTURE W GRAM STAIN
Culture: NO GROWTH
Special Requests: NORMAL

## 2016-10-12 DIAGNOSIS — M1990 Unspecified osteoarthritis, unspecified site: Secondary | ICD-10-CM

## 2016-10-12 HISTORY — DX: Unspecified osteoarthritis, unspecified site: M19.90

## 2017-05-19 DIAGNOSIS — M9905 Segmental and somatic dysfunction of pelvic region: Secondary | ICD-10-CM | POA: Diagnosis not present

## 2017-05-19 DIAGNOSIS — M5417 Radiculopathy, lumbosacral region: Secondary | ICD-10-CM | POA: Diagnosis not present

## 2017-05-19 DIAGNOSIS — M62830 Muscle spasm of back: Secondary | ICD-10-CM | POA: Diagnosis not present

## 2017-05-19 DIAGNOSIS — M9901 Segmental and somatic dysfunction of cervical region: Secondary | ICD-10-CM | POA: Diagnosis not present

## 2017-05-19 DIAGNOSIS — M4317 Spondylolisthesis, lumbosacral region: Secondary | ICD-10-CM | POA: Diagnosis not present

## 2017-05-19 DIAGNOSIS — M9903 Segmental and somatic dysfunction of lumbar region: Secondary | ICD-10-CM | POA: Diagnosis not present

## 2017-05-19 DIAGNOSIS — M531 Cervicobrachial syndrome: Secondary | ICD-10-CM | POA: Diagnosis not present

## 2017-05-19 DIAGNOSIS — M9902 Segmental and somatic dysfunction of thoracic region: Secondary | ICD-10-CM | POA: Diagnosis not present

## 2017-06-18 DIAGNOSIS — M62830 Muscle spasm of back: Secondary | ICD-10-CM | POA: Diagnosis not present

## 2017-06-18 DIAGNOSIS — M9903 Segmental and somatic dysfunction of lumbar region: Secondary | ICD-10-CM | POA: Diagnosis not present

## 2017-06-18 DIAGNOSIS — M9901 Segmental and somatic dysfunction of cervical region: Secondary | ICD-10-CM | POA: Diagnosis not present

## 2017-06-18 DIAGNOSIS — M9902 Segmental and somatic dysfunction of thoracic region: Secondary | ICD-10-CM | POA: Diagnosis not present

## 2017-06-18 DIAGNOSIS — M531 Cervicobrachial syndrome: Secondary | ICD-10-CM | POA: Diagnosis not present

## 2017-06-18 DIAGNOSIS — M4317 Spondylolisthesis, lumbosacral region: Secondary | ICD-10-CM | POA: Diagnosis not present

## 2017-06-18 DIAGNOSIS — M5417 Radiculopathy, lumbosacral region: Secondary | ICD-10-CM | POA: Diagnosis not present

## 2017-06-18 DIAGNOSIS — M9905 Segmental and somatic dysfunction of pelvic region: Secondary | ICD-10-CM | POA: Diagnosis not present

## 2017-07-07 DIAGNOSIS — M4316 Spondylolisthesis, lumbar region: Secondary | ICD-10-CM | POA: Diagnosis not present

## 2017-07-21 DIAGNOSIS — M9905 Segmental and somatic dysfunction of pelvic region: Secondary | ICD-10-CM | POA: Diagnosis not present

## 2017-07-21 DIAGNOSIS — M9901 Segmental and somatic dysfunction of cervical region: Secondary | ICD-10-CM | POA: Diagnosis not present

## 2017-07-21 DIAGNOSIS — M4317 Spondylolisthesis, lumbosacral region: Secondary | ICD-10-CM | POA: Diagnosis not present

## 2017-07-21 DIAGNOSIS — M531 Cervicobrachial syndrome: Secondary | ICD-10-CM | POA: Diagnosis not present

## 2017-07-21 DIAGNOSIS — M62830 Muscle spasm of back: Secondary | ICD-10-CM | POA: Diagnosis not present

## 2017-07-21 DIAGNOSIS — M9902 Segmental and somatic dysfunction of thoracic region: Secondary | ICD-10-CM | POA: Diagnosis not present

## 2017-07-21 DIAGNOSIS — M4316 Spondylolisthesis, lumbar region: Secondary | ICD-10-CM | POA: Diagnosis not present

## 2017-07-21 DIAGNOSIS — M5417 Radiculopathy, lumbosacral region: Secondary | ICD-10-CM | POA: Diagnosis not present

## 2017-07-21 DIAGNOSIS — M9903 Segmental and somatic dysfunction of lumbar region: Secondary | ICD-10-CM | POA: Diagnosis not present

## 2017-07-26 DIAGNOSIS — H5213 Myopia, bilateral: Secondary | ICD-10-CM | POA: Diagnosis not present

## 2017-07-26 DIAGNOSIS — H52221 Regular astigmatism, right eye: Secondary | ICD-10-CM | POA: Diagnosis not present

## 2017-07-28 DIAGNOSIS — M4316 Spondylolisthesis, lumbar region: Secondary | ICD-10-CM | POA: Diagnosis not present

## 2017-08-10 DIAGNOSIS — M9905 Segmental and somatic dysfunction of pelvic region: Secondary | ICD-10-CM | POA: Diagnosis not present

## 2017-08-10 DIAGNOSIS — M531 Cervicobrachial syndrome: Secondary | ICD-10-CM | POA: Diagnosis not present

## 2017-08-10 DIAGNOSIS — M9902 Segmental and somatic dysfunction of thoracic region: Secondary | ICD-10-CM | POA: Diagnosis not present

## 2017-08-10 DIAGNOSIS — M62830 Muscle spasm of back: Secondary | ICD-10-CM | POA: Diagnosis not present

## 2017-08-10 DIAGNOSIS — M5417 Radiculopathy, lumbosacral region: Secondary | ICD-10-CM | POA: Diagnosis not present

## 2017-08-10 DIAGNOSIS — M4317 Spondylolisthesis, lumbosacral region: Secondary | ICD-10-CM | POA: Diagnosis not present

## 2017-08-10 DIAGNOSIS — M9903 Segmental and somatic dysfunction of lumbar region: Secondary | ICD-10-CM | POA: Diagnosis not present

## 2017-08-10 DIAGNOSIS — M9901 Segmental and somatic dysfunction of cervical region: Secondary | ICD-10-CM | POA: Diagnosis not present

## 2017-10-01 DIAGNOSIS — M9903 Segmental and somatic dysfunction of lumbar region: Secondary | ICD-10-CM | POA: Diagnosis not present

## 2017-10-01 DIAGNOSIS — M5417 Radiculopathy, lumbosacral region: Secondary | ICD-10-CM | POA: Diagnosis not present

## 2017-10-01 DIAGNOSIS — M4317 Spondylolisthesis, lumbosacral region: Secondary | ICD-10-CM | POA: Diagnosis not present

## 2017-10-01 DIAGNOSIS — M62830 Muscle spasm of back: Secondary | ICD-10-CM | POA: Diagnosis not present

## 2017-10-01 DIAGNOSIS — M9901 Segmental and somatic dysfunction of cervical region: Secondary | ICD-10-CM | POA: Diagnosis not present

## 2017-10-01 DIAGNOSIS — M531 Cervicobrachial syndrome: Secondary | ICD-10-CM | POA: Diagnosis not present

## 2017-10-01 DIAGNOSIS — M9902 Segmental and somatic dysfunction of thoracic region: Secondary | ICD-10-CM | POA: Diagnosis not present

## 2017-10-01 DIAGNOSIS — M9905 Segmental and somatic dysfunction of pelvic region: Secondary | ICD-10-CM | POA: Diagnosis not present

## 2017-10-13 ENCOUNTER — Ambulatory Visit (INDEPENDENT_AMBULATORY_CARE_PROVIDER_SITE_OTHER): Payer: Self-pay | Admitting: Emergency Medicine

## 2017-10-13 ENCOUNTER — Encounter: Payer: Self-pay | Admitting: Emergency Medicine

## 2017-10-13 VITALS — BP 112/82 | HR 81 | Temp 98.9°F | Resp 18 | Wt 232.8 lb

## 2017-10-13 DIAGNOSIS — J060 Acute laryngopharyngitis: Secondary | ICD-10-CM

## 2017-10-13 DIAGNOSIS — J020 Streptococcal pharyngitis: Secondary | ICD-10-CM

## 2017-10-13 LAB — POCT RAPID STREP A (OFFICE): Rapid Strep A Screen: POSITIVE — AB

## 2017-10-13 MED ORDER — MAGIC MOUTHWASH W/LIDOCAINE: 5.0000 mL | Freq: Three times a day (TID) | ORAL | 0 refills | Status: DC | PRN

## 2017-10-13 MED ORDER — AMOXICILLIN 500 MG PO CAPS: 1000.0000 mg | ORAL_CAPSULE | Freq: Two times a day (BID) | ORAL | 0 refills | Status: AC

## 2017-10-13 MED ORDER — FLUCONAZOLE 200 MG PO TABS: ORAL_TABLET | ORAL | 1 refills | Status: DC

## 2017-10-13 MED FILL — AMOXICILLIN 500 MG CAPSULE: 500 | 10 days supply | Qty: 40 | Fill #0

## 2017-10-13 MED FILL — FLUCONAZOLE 200 MG TABLET: 200 | 3 days supply | Qty: 2 | Fill #0

## 2017-10-13 MED FILL — MAGIC MW (LID/NYS/MAA/BEN): 8 days supply | Qty: 120 | Fill #0

## 2017-10-13 NOTE — Patient Instructions (Signed)

## 2017-10-13 NOTE — Progress Notes (Signed)
  Subjective:     History was provided by the patient. Emily Miller is a 32 y.o. female who presents for evaluation of a sore throat. Associated symptoms include sore throat, swollen glands and white spots in throat. Onset of symptoms was 3 days ago, gradually worsening since that time.  She is drinking plenty of fluids. She has had recent close exposure to someone with proven streptococcal pharyngitis. The following portions of the patient's history were reviewed and updated as appropriate: allergies, current medications, past social history and problem list.  Review of Systems Pertinent items are noted in HPI.    Objective:    BP 112/82 (BP Location: Right Arm, Patient Position: Sitting, Cuff Size: Normal)   Pulse 81   Temp 98.9 F (37.2 C) (Oral)   Resp 18   Wt 232 lb 12.8 oz (105.6 kg)   BMI 34.38 kg/m  General appearance: alert, cooperative and appears stated age Head: Normocephalic, without obvious abnormality, atraumatic Eyes: negative Ears: normal TM's and external ear canals both ears Nose: Nares normal. Septum midline. Mucosa normal. No drainage or sinus tenderness., no discharge Throat: abnormal findings: marked oropharyngeal erythema and uvula midline, no evidence of abscess Neck: mild anterior cervical adenopathy, no JVD and supple, symmetrical, trachea midline Lungs: clear to auscultation bilaterally Heart: regular rate and rhythm Extremities: extremities normal, atraumatic, no cyanosis or edema Pulses: 2+ and symmetric Lymph nodes: Cervical adenopathy: anterior    Results for orders placed or performed in visit on 10/13/17  POCT rapid strep A  Result Value Ref Range   Rapid Strep A Screen Positive (A) Negative     Assessment:    Strep throat.    Plan:    Patient placed on antibiotics. Use of OTC analgesics recommended as well as salt water gargles. Follow up in 1 week. Magic Mouthwash for pain

## 2017-10-15 ENCOUNTER — Telehealth: Payer: Self-pay

## 2017-10-15 NOTE — Telephone Encounter (Signed)
Called to follow up with pt to see how she was feeling and pt states she is feeling much better.

## 2017-10-29 ENCOUNTER — Other Ambulatory Visit (HOSPITAL_COMMUNITY)
Admission: RE | Admit: 2017-10-29 | Discharge: 2017-10-29 | Disposition: A | Discharge status: Home / Self Care | Payer: No Typology Code available for payment source | Source: Ambulatory Visit | Attending: Family Medicine | Admitting: Family Medicine

## 2017-10-29 ENCOUNTER — Other Ambulatory Visit: Payer: Self-pay | Admitting: Family Medicine

## 2017-10-29 DIAGNOSIS — Z124 Encounter for screening for malignant neoplasm of cervix: Secondary | ICD-10-CM | POA: Diagnosis not present

## 2017-11-02 LAB — CYTOLOGY - PAP: Diagnosis: NEGATIVE

## 2017-11-08 MED FILL — ETODOLAC 400 MG TABLET: 400 | 30 days supply | Qty: 60 | Fill #0

## 2017-11-08 MED FILL — VIT D3-50 50,000 UNITS CAPS: 1.25 MG | 28 days supply | Qty: 4 | Fill #0

## 2017-11-11 MED FILL — AMOXICILLIN 875 MG TABLET: 875 | 10 days supply | Qty: 20 | Fill #0

## 2017-12-03 MED FILL — ETODOLAC 400 MG TABLET: 400 | 30 days supply | Qty: 60 | Fill #1

## 2017-12-03 MED FILL — VIT D3-50 50,000 UNITS CAPS: 1.25 MG | 28 days supply | Qty: 4 | Fill #1

## 2017-12-13 ENCOUNTER — Encounter: Payer: Self-pay | Admitting: Psychiatry

## 2017-12-13 ENCOUNTER — Ambulatory Visit (INDEPENDENT_AMBULATORY_CARE_PROVIDER_SITE_OTHER): Payer: Self-pay | Admitting: Psychiatry

## 2017-12-13 VITALS — BP 112/78 | HR 81 | Temp 98.5°F | Resp 18 | Wt 243.0 lb

## 2017-12-13 DIAGNOSIS — J029 Acute pharyngitis, unspecified: Secondary | ICD-10-CM

## 2017-12-13 LAB — POCT RAPID STREP A (OFFICE): Rapid Strep A Screen: NEGATIVE

## 2017-12-13 NOTE — Progress Notes (Signed)
Emily Miller is a 32 y.o. female presenting with a sore throat for since last night. Reports feeling hot yesterday but no fever, no sob, no chest pain and no cough, feeling hot and cold since this morning. Have not taken any medicaton, had a flu shot this season. Have not being around any sick person that she knows of.  ROS: completed and was negative except as stated in HPI  Exam:  BP 112/78 (BP Location: Right Arm, Patient Position: Sitting, Cuff Size: Normal)   Pulse 81   Temp 98.5 F (36.9 C) (Oral)   Resp 18   Wt 243 lb (110.2 kg)   SpO2 98%   BMI 35.88 kg/m    Physical Exam  Constitutional: She appears well-developed and well-nourished.  HENT:  Mouth/Throat: Oropharynx is clear and moist. No oropharyngeal exudate, posterior oropharyngeal edema, posterior oropharyngeal erythema or tonsillar abscesses.  Eyes: Pupils are equal, round, and reactive to light.  Neck: Normal range of motion.  Cardiovascular: Normal rate and regular rhythm.  Pulmonary/Chest: Effort normal and breath sounds normal. No respiratory distress.    Results for orders placed or performed in visit on 12/13/17 (from the past 24 hour(s))  POCT rapid strep A     Status: Normal   Collection Time: 12/13/17  8:32 AM  Result Value Ref Range   Rapid Strep A Screen Negative Negative     Diagnoses and all orders for this visit:  Sore throat -     POCT rapid strep A  Plan: Salt warm gargles Ibuprofen prn  Increase hydration Return to the clinic if not better in 3-5 days or go to the ED with worsening sx.  Patient Instructions    Sore Throat When you have a sore throat, your throat may:  Hurt.  Burn.  Feel irritated.  Feel scratchy.  Many things can cause a sore throat, including:  An infection.  Allergies.  Dryness in the air.  Smoke or pollution.  Gastroesophageal reflux disease (GERD).  A tumor.  A sore throat can be the first sign of another sickness. It can happen with other  problems, like coughing or a fever. Most sore throats go away without treatment. Follow these instructions at home:  Take over-the-counter medicines only as told by your doctor.  Drink enough fluids to keep your pee (urine) clear or pale yellow.  Rest when you feel you need to.  To help with pain, try: ? Sipping warm liquids, such as broth, herbal tea, or warm water. ? Eating or drinking cold or frozen liquids, such as frozen ice pops. ? Gargling with a salt-water mixture 3-4 times a day or as needed. To make a salt-water mixture, add -1 tsp of salt in 1 cup of warm water. Mix it until you cannot see the salt anymore. ? Sucking on hard candy or throat lozenges. ? Putting a cool-mist humidifier in your bedroom at night. ? Sitting in the bathroom with the door closed for 5-10 minutes while you run hot water in the shower.  Do not use any tobacco products, such as cigarettes, chewing tobacco, and e-cigarettes. If you need help quitting, ask your doctor. Contact a doctor if:  You have a fever for more than 2-3 days.  You keep having symptoms for more than 2-3 days.  Your throat does not get better in 7 days.  You have a fever and your symptoms suddenly get worse. Get help right away if:  You have trouble breathing.  You cannot swallow fluids,  soft foods, or your saliva.  You have swelling in your throat or neck that gets worse.  You keep feeling like you are going to throw up (vomit).  You keep throwing up. This information is not intended to replace advice given to you by your health care provider. Make sure you discuss any questions you have with your health care provider. Document Released: 07/07/2008 Document Revised: 05/24/2016 Document Reviewed: 07/19/2015 Elsevier Interactive Patient Education  [employer].

## 2017-12-13 NOTE — Patient Instructions (Addendum)

## 2017-12-14 ENCOUNTER — Ambulatory Visit (INDEPENDENT_AMBULATORY_CARE_PROVIDER_SITE_OTHER): Payer: Self-pay | Admitting: Emergency Medicine

## 2017-12-14 VITALS — BP 124/78 | HR 101 | Temp 98.9°F | Resp 17

## 2017-12-14 DIAGNOSIS — J029 Acute pharyngitis, unspecified: Secondary | ICD-10-CM

## 2017-12-14 DIAGNOSIS — J020 Streptococcal pharyngitis: Secondary | ICD-10-CM

## 2017-12-14 LAB — POCT RAPID STREP A (OFFICE): Rapid Strep A Screen: POSITIVE — AB

## 2017-12-14 MED ORDER — FLUCONAZOLE 200 MG PO TABS: ORAL_TABLET | ORAL | 0 refills | Status: DC

## 2017-12-14 MED ORDER — MAGIC MOUTHWASH W/LIDOCAINE: 5.0000 mL | Freq: Three times a day (TID) | ORAL | 0 refills | Status: DC | PRN

## 2017-12-14 MED ORDER — AZITHROMYCIN 500 MG PO TABS: 500.0000 mg | ORAL_TABLET | Freq: Every day | ORAL | 0 refills | Status: DC

## 2017-12-14 MED FILL — FLUCONAZOLE 200 MG TABLET: 200 | 3 days supply | Qty: 2 | Fill #0

## 2017-12-14 MED FILL — MAGIC MW LID/MAAL/DP1:1:1: 2 | 6 days supply | Qty: 100 | Fill #0

## 2017-12-14 MED FILL — AZITHROMYCIN 500 MG TABLET: 500 | 5 days supply | Qty: 5 | Fill #0

## 2017-12-14 NOTE — Progress Notes (Signed)
S: Emily Miller is a 32 y.o. female who presents for reevaluation of sore throat. Was seen in clinic yesterday, tested RSS (-). Patient feels as if she is getting worse. Has been using NSAIDS, chloreseptic, and other OTC meds without relief. Has had exposure to strep throat.  Review of Systems  Constitutional: Positive for chills and malaise/fatigue. Negative for fever.  HENT: Positive for sore throat. Negative for congestion.   Respiratory: Negative.   Cardiovascular: Negative.   Gastrointestinal: Positive for nausea. Negative for abdominal pain, diarrhea and vomiting.  Musculoskeletal: Negative.   Neurological: Negative.     O: Vitals:   12/14/17 0830  BP: 124/78  Pulse: (!) 101  Resp: 17  Temp: 98.9 F (37.2 C)  SpO2: 99%   Physical Exam  Constitutional: She appears well-developed and well-nourished. No distress.  HENT:  Head: Normocephalic and atraumatic.  Right Ear: Tympanic membrane and external ear normal.  Left Ear: Tympanic membrane and external ear normal.  Nose: Nose normal.  Mouth/Throat: Uvula is midline. Posterior oropharyngeal erythema present. Tonsillar exudate.  Eyes: Conjunctivae are normal.  Neck: Normal range of motion.  Cardiovascular: Regular rhythm. Tachycardia present.  Pulmonary/Chest: Effort normal and breath sounds normal.  Lymphadenopathy:    She has cervical adenopathy.  Skin: Skin is warm. Capillary refill takes less than 2 seconds. She is not diaphoretic.  Nursing note and vitals reviewed.  Results for orders placed or performed in visit on 12/14/17  POCT rapid strep A  Result Value Ref Range   Rapid Strep A Screen Positive (A) Negative    A: 1. Sore throat   2. Strep pharyngitis     P: 1. Sore throat  - POCT rapid strep A  2. Strep pharyngitis  - azithromycin (ZITHROMAX) 500 MG tablet; Take 1 tablet (500 mg total) by mouth daily.  Dispense: 5 tablet; Refill: 0 - fluconazole (DIFLUCAN) 200 MG tablet; Take one tablet today,  wait 3 days, take the second tablet  Dispense: 2 tablet; Refill: 0 - magic mouthwash w/lidocaine SOLN; Take 5 mLs by mouth 3 (three) times daily as needed for mouth pain.  Dispense: 100 mL; Refill: 0

## 2017-12-14 NOTE — Patient Instructions (Signed)

## 2017-12-17 ENCOUNTER — Telehealth: Payer: Self-pay

## 2017-12-27 MED FILL — VIT D3-50 50,000 UNITS CAPS: 1.25 MG | 28 days supply | Qty: 4 | Fill #2

## 2017-12-30 MED FILL — ETODOLAC 400 MG TABLET: 400 | 30 days supply | Qty: 60 | Fill #2

## 2018-01-24 MED FILL — ETODOLAC 400 MG TABLET: 400 | 30 days supply | Qty: 60 | Fill #3

## 2018-01-24 MED FILL — VIT D3-50 50,000 UNITS CAPS: 1.25 MG | 28 days supply | Qty: 4 | Fill #3

## 2018-02-24 MED FILL — VIT D3-50 50,000 UNITS CAPS: 1.25 MG | 28 days supply | Qty: 4 | Fill #4

## 2018-03-10 MED FILL — VIT D3-50 50,000 UNITS CAPS: 1.25 MG | 28 days supply | Qty: 4 | Fill #5

## 2018-03-10 MED FILL — NORETHIN-ESTRAD-FERR 1-0.02: 1-20 | 84 days supply | Qty: 84 | Fill #0

## 2018-03-10 MED FILL — ETODOLAC 400 MG TABLET: 400 | 30 days supply | Qty: 60 | Fill #4

## 2018-03-10 MED FILL — metroNIDAZOLE 500 MG TABS: 500 | 7 days supply | Qty: 14 | Fill #0

## 2018-03-24 MED FILL — FLUCONAZOLE 150 MG TABS: 150 | 6 days supply | Qty: 2 | Fill #0

## 2018-04-11 MED FILL — ETODOLAC 400 MG TABLET: 400 | 30 days supply | Qty: 60 | Fill #5

## 2018-04-11 MED FILL — VIT D3-50 50,000 UNITS CAPS: 1.25 MG | 28 days supply | Qty: 4 | Fill #6

## 2018-05-12 MED FILL — VIT D3-50 50,000 UNITS CAPS: 1.25 MG | 28 days supply | Qty: 4 | Fill #7

## 2018-05-12 MED FILL — ETODOLAC 400 MG TABLET: 400 | 30 days supply | Qty: 60 | Fill #6

## 2018-05-23 MED FILL — NORETHIN-ESTRAD-FERR 1-0.02: 1-20 | 84 days supply | Qty: 84 | Fill #1

## 2018-06-04 ENCOUNTER — Ambulatory Visit (INDEPENDENT_AMBULATORY_CARE_PROVIDER_SITE_OTHER): Payer: Self-pay | Admitting: Family Medicine

## 2018-06-04 ENCOUNTER — Encounter: Payer: Self-pay | Admitting: Family Medicine

## 2018-06-04 VITALS — BP 112/78 | HR 97 | Temp 98.9°F | Resp 20 | Wt 243.4 lb

## 2018-06-04 DIAGNOSIS — J029 Acute pharyngitis, unspecified: Secondary | ICD-10-CM

## 2018-06-04 LAB — POCT RAPID STREP A (OFFICE): Rapid Strep A Screen: NEGATIVE

## 2018-06-04 NOTE — Progress Notes (Signed)
Emily Miller is a 32 y.o. female who presents today with concerns of sore throat that has persisted for the last 4 days. She reports that she has children at home who are sick. She denies any other known sick contacts and denies running a fever or any other symptoms.   Review of Systems  Constitutional: Negative for chills, fever and malaise/fatigue.  HENT: Positive for sore throat. Negative for congestion, ear discharge, ear pain and sinus pain.   Eyes: Negative.   Respiratory: Negative for cough, sputum production and shortness of breath.   Cardiovascular: Negative.  Negative for chest pain.  Gastrointestinal: Negative for abdominal pain, diarrhea, nausea and vomiting.  Genitourinary: Negative for dysuria, frequency, hematuria and urgency.  Musculoskeletal: Negative for myalgias.  Skin: Negative.   Neurological: Negative for headaches.  Endo/Heme/Allergies: Negative.   Psychiatric/Behavioral: Negative.     O: Vitals:   06/04/18 1259  BP: 112/78  Pulse: 97  Resp: 20  Temp: 98.9 F (37.2 C)  SpO2: 98%     Physical Exam  Constitutional: She is oriented to person, place, and time. Vital signs are normal. She appears well-developed and well-nourished. She is active.  Non-toxic appearance. She does not have a sickly appearance.  HENT:  Head: Normocephalic.  Right Ear: Hearing, tympanic membrane, external ear and ear canal normal.  Left Ear: Hearing, tympanic membrane, external ear and ear canal normal.  Nose: Nose normal.  Mouth/Throat: Uvula is midline and oropharynx is clear and moist.  Neck: Normal range of motion. Neck supple.  Cardiovascular: Normal rate, regular rhythm, normal heart sounds and normal pulses.  Pulmonary/Chest: Effort normal and breath sounds normal.  Abdominal: Soft. Bowel sounds are normal.  Musculoskeletal: Normal range of motion.  Lymphadenopathy:       Head (right side): No submental and no submandibular adenopathy present.       Head (left side):  No submental and no submandibular adenopathy present.    She has no cervical adenopathy.  Neurological: She is alert and oriented to person, place, and time.  Psychiatric: She has a normal mood and affect.  Vitals reviewed.  A: 1. Sore throat    P: Discussed exam findings, diagnosis etiology and medication use and indications reviewed with patient. Follow- Up and discharge instructions provided. No emergent/urgent issues found on exam.  Patient verbalized understanding of information provided and agrees with plan of care (POC), all questions answered.  1. Sore throat - POCT rapid strep A Results for orders placed or performed in visit on 06/04/18 (from the past 24 hour(s))  POCT rapid strep A     Status: Normal   Collection Time: 06/04/18  1:12 PM  Result Value Ref Range   Rapid Strep A Screen Negative Negative   No bacterial indications on exam that was overall unremarkable for illness-Advised patient to continue symptomatic treatment (antipyretic/pain reliever, salt water gargles, throat lozenges- work note x 48 hours provided for patient to use if necessary.

## 2018-06-04 NOTE — Patient Instructions (Signed)

## 2018-06-27 MED FILL — ETODOLAC 400 MG TABLET: 400 | 30 days supply | Qty: 60 | Fill #7

## 2018-06-27 MED FILL — VIT D3-50 50,000 UNITS CAPS: 1.25 MG | 28 days supply | Qty: 4 | Fill #8

## 2018-08-10 MED FILL — NORETHIN-ESTRAD-FERR 1-0.02: 1-20 | 84 days supply | Qty: 84 | Fill #2

## 2018-09-03 ENCOUNTER — Telehealth: Payer: No Typology Code available for payment source | Admitting: Nurse Practitioner

## 2018-09-03 DIAGNOSIS — N3001 Acute cystitis with hematuria: Secondary | ICD-10-CM

## 2018-09-03 MED ORDER — CEPHALEXIN 500 MG PO CAPS: 500.0000 mg | ORAL_CAPSULE | Freq: Two times a day (BID) | ORAL | 0 refills | Status: DC

## 2018-09-03 NOTE — Progress Notes (Signed)

## 2018-10-23 ENCOUNTER — Ambulatory Visit (INDEPENDENT_AMBULATORY_CARE_PROVIDER_SITE_OTHER): Payer: Self-pay | Admitting: Physician Assistant

## 2018-10-23 VITALS — BP 124/80 | HR 98 | Temp 99.4°F | Resp 20 | Wt 244.6 lb

## 2018-10-23 DIAGNOSIS — J029 Acute pharyngitis, unspecified: Secondary | ICD-10-CM

## 2018-10-23 LAB — POCT RAPID STREP A (OFFICE): Rapid Strep A Screen: NEGATIVE

## 2018-10-23 NOTE — Patient Instructions (Signed)
- You may take ibuprofen 600-800mg  with food for your throat every 8 hours. Tylenol can also be used for sore throat. - You may also use 2 tablespoons of warm honey every 4-6 hours to coat and soothe your throat. - Drink plenty of water, at least 64 ounces daily and rest to make sure your body has a chance to get better. - Return to our clinic in 3-5 days if your symptoms have not improved. Seek care sooner if symptoms worsen.    Sore Throat When you have a sore throat, your throat may feel:  Tender.  Burning.  Irritated.  Scratchy.  Painful when you swallow.  Painful when you talk. Many things can cause a sore throat, such as:  An infection.  Allergies.  Dry air.  Smoke or pollution.  Radiation treatment.  Gastroesophageal reflux disease (GERD).  A tumor. A sore throat can be the first sign of another sickness. It can happen with other problems, like:  Coughing.  Sneezing.  Fever.  Swelling in the neck. Most sore throats go away without treatment. Follow these instructions at home:      Take over-the-counter medicines only as told by your doctor. ? If your child has a sore throat, do not give your child aspirin.  Drink enough fluids to keep your pee (urine) pale yellow.  Rest when you feel you need to.  To help with pain: ? Sip warm liquids, such as broth, herbal tea, or warm water. ? Eat or drink cold or frozen liquids, such as frozen ice pops. ? Gargle with a salt-water mixture 3-4 times a day or as needed. To make a salt-water mixture, add -1 tsp (3-6 g) of salt to 1 cup (237 mL) of warm water. Mix it until you cannot see the salt anymore. ? Suck on hard candy or throat lozenges. ? Put a cool-mist humidifier in your bedroom at night. ? Sit in the bathroom with the door closed for 5-10 minutes while you run hot water in the shower.  Do not use any products that contain nicotine or tobacco, such as cigarettes, e-cigarettes, and chewing tobacco. If  you need help quitting, ask your doctor.  Wash your hands well and often with soap and water. If soap and water are not available, use hand sanitizer. Contact a doctor if:  You have a fever for more than 2-3 days.  You keep having symptoms for more than 2-3 days.  Your throat does not get better in 7 days.  You have a fever and your symptoms suddenly get worse.  Your child who is 3 months to 35 years old has a temperature of 102.18F (39C) or higher. Get help right away if:  You have trouble breathing.  You cannot swallow fluids, soft foods, or your saliva.  You have swelling in your throat or neck that gets worse.  You keep feeling sick to your stomach (nauseous).  You keep throwing up (vomiting). Summary  A sore throat is pain, burning, irritation, or scratchiness in the throat. Many things can cause a sore throat.  Take over-the-counter medicines only as told by your doctor. Do not give your child aspirin.  Drink plenty of fluids, and rest as needed.  Contact a doctor if your symptoms get worse or your sore throat does not get better within 7 days. This information is not intended to replace advice given to you by your health care provider. Make sure you discuss any questions you have with your health care  provider. Document Released: 07/07/2008 Document Revised: 02/28/2018 Document Reviewed: 02/28/2018 Elsevier Interactive Patient Education  2019 [employer].

## 2018-10-23 NOTE — Progress Notes (Signed)
MRN: [id number] DOB: [birth year]  Subjective:   Emily Miller is a 33 y.o. female presenting for chief complaint of sore throat when she woke up this morning. Denies fever, rhinorrhea, ear fullness, inability to swallow, voice change, dry cough, productive cough, wheezing, shortness of breath, chest tightness, chest pain and myalgia, night sweats, chills, fatigue, nausea, vomiting, abdominal pain and diarrhea. Has not tried anything for relief. No recent sick contact exposure, but she has kids in preschool so she is very cautious when she has strep as she has a hx of strep throat. No PMH of seasonal allergies or asthma. Denies smoking. . Patient has had flu shot this season. Of note, patient has been seen at [facility] for sore throat on 10/13/2017 (positive strep test), 12/13/2017 (negative strep test), 12/14/2017  (positive strep test), and 06/04/2018 (negative strep test).  Past surgical history of tonsillectomy.  Review of Systems  Respiratory: Negative for stridor.   Musculoskeletal: Negative for neck pain.  Skin: Negative for rash.  Neurological: Negative for dizziness.    Emily Miller has a current medication list which includes the following prescription(s): norethindrone-ethinyl estradiol-iron, cephalexin, etodolac, magic mouthwash w/lidocaine, magic mouthwash w/lidocaine, and vitamin d (ergocalciferol). Also has No Known Allergies.  Emily Miller  has a past medical history of Asthma. Also  has a past surgical history that includes Tonsilectomy/adenoidectomy with myringotomy.   Objective:   Vitals: BP 124/80 (BP Location: Right Arm, Patient Position: Sitting, Cuff Size: Normal)   Pulse 98   Temp 99.4 F (37.4 C) (Oral)   Resp 20   Wt 244 lb 9.6 oz (110.9 kg)   SpO2 100%   BMI 36.12 kg/m   Physical Exam Vitals signs reviewed.  Constitutional:      General: She is not in acute distress.    Appearance: She is well-developed. She is not ill-appearing or toxic-appearing.  HENT:     Head:  Normocephalic and atraumatic.     Right Ear: Tympanic membrane, ear canal and external ear normal.     Left Ear: Tympanic membrane, ear canal and external ear normal.     Nose: Nose normal.     Mouth/Throat:     Lips: Pink.     Mouth: Mucous membranes are moist.     Pharynx: Posterior oropharyngeal erythema present. No pharyngeal swelling or uvula swelling.     Tonsils: Swelling: 0 on the right. 0 on the left.     Comments: S/p tonsillectomy. Eyes:     Conjunctiva/sclera: Conjunctivae normal.  Neck:     Musculoskeletal: Full passive range of motion without pain and normal range of motion.  Pulmonary:     Effort: Pulmonary effort is normal.  Lymphadenopathy:     Head:     Right side of head: No submental, submandibular, tonsillar, preauricular, posterior auricular or occipital adenopathy.     Left side of head: No submental, submandibular, tonsillar, preauricular, posterior auricular or occipital adenopathy.     Cervical: No cervical adenopathy.     Upper Body:     Right upper body: No supraclavicular adenopathy.     Left upper body: No supraclavicular adenopathy.  Skin:    General: Skin is warm and dry.  Neurological:     Mental Status: She is alert and oriented to person, place, and time.     Results for orders placed or performed in visit on 10/23/18 (from the past 24 hour(s))  POCT rapid strep A     Status: Normal   Collection Time: 10/23/18 11:51 AM  Result Value Ref Range   Rapid Strep A Screen Negative Negative    Centor score of 1. Assessment and Plan :  1. Sore throat Patient is overall well-appearing, no acute distress.  Temp low-grade at 99.4.  Posterior oropharynx is mildly erythematous.  Status post tonsillectomy.  No cervical lymph nodes.  No nuchal rigidity, drooling, stridor, hot potato voice, or signs of respiratory distress noted on exam to suggest more concerning etiology of sore throat such as epiglottitis, peritonsillar abscess, retropharyngeal abscess.Marland Kitchen   POC strep test negative, patient reassured.  Likely viral in etiology.  No red flags noted on exam.  Would recommend symptomatic treatment at this time with over-the-counter ibuprofen or Tylenol as prescribed.  Use warm fluids, tea with honey, or throat lunges.Exam findings and  treatment plan reviewed with patient. Follow- Up and discharge instructions provided.  Patient education was provided. Patient verbalized understanding of information provided and agrees with plan of care (POC), all questions answered. No barriers to understanding were identified. Red flags discussed in detail. The patient is advised to return to our clinic if condition does not see an improvement in symptoms, or to seek the care of the closest urgent care/ emergency department if condition worsens/develops new concerning sx with the above plan.  - POCT rapid strep A    Benjiman Core, PA-C  [facility] 10/23/2018 3:20 PM

## 2018-10-27 ENCOUNTER — Ambulatory Visit (INDEPENDENT_AMBULATORY_CARE_PROVIDER_SITE_OTHER): Payer: Self-pay | Admitting: Nurse Practitioner

## 2018-10-27 VITALS — BP 105/70 | HR 83 | Temp 97.7°F | Resp 18 | Wt 246.0 lb

## 2018-10-27 DIAGNOSIS — J029 Acute pharyngitis, unspecified: Secondary | ICD-10-CM

## 2018-10-27 LAB — POCT RAPID STREP A (OFFICE): Rapid Strep A Screen: NEGATIVE

## 2018-10-27 MED ORDER — LIDOCAINE VISCOUS HCL 2 % MT SOLN: 5.0000 mL | Freq: Four times a day (QID) | OROMUCOSAL | 0 refills | Status: DC | PRN

## 2018-10-27 MED FILL — LIDOCAINE 2% VISCOUS SOLN: 2 | 5 days supply | Qty: 100 | Fill #0

## 2018-10-27 NOTE — Progress Notes (Signed)
Subjective:     Emily Miller is a 33 y.o. female who presents for evaluation of sore throat.  The patient was seen in our office on 10/23/2018 when her symptoms initially started.  Patient symptoms at that time were headache, sore throat, and feels.  Patient had a strep performed in our office which was negative.  Patient has had a history of a tonsillectomy.  During that visit the patient did not display any signs of nuchal rigidity, drooling, stridor, tonsillar abscess, or retroperitoneal abscess.  Patient states she was diagnosed with sore throat and was told to take ibuprofen.  Patient states she is been taking ibuprofen with minimal relief.  Patient states her symptoms have been unchanged since that time.  Patient rates her current throat pain 5/10 at present, describes the pain as "throbbing".  Patient continues to deny abdominal pain, nausea, vomiting, fever, malaise, decreased appetite, cough, congestion, or runny nose.   The following portions of the patient's history were reviewed and updated as appropriate: allergies, current medications and past medical history.  Review of Systems Constitutional: positive for chills and fatigue, negative for anorexia, fevers, malaise and sweats Eyes: negative Ears, nose, mouth, throat, and face: positive for sore throat, negative for ear drainage, earaches and nasal congestion Respiratory: negative Cardiovascular: negative Gastrointestinal: negative Neurological: positive for headaches, negative for coordination problems, dizziness, gait problems, paresthesia, tremors, vertigo and weakness    Objective:    BP 105/70 (BP Location: Right Arm, Patient Position: Sitting, Cuff Size: Normal)   Pulse 83   Temp 97.7 F (36.5 C) (Oral)   Resp 18   Wt 245 lb 15.7 oz (111.6 kg)   SpO2 100%   BMI 36.32 kg/m   Physical Exam Constitutional:      General: She is not in acute distress.    Appearance: She is well-developed.  HENT:     Head:  Normocephalic.     Right Ear: Tympanic membrane and ear canal normal.     Left Ear: Tympanic membrane and ear canal normal.     Nose: No congestion.     Mouth/Throat:     Mouth: Mucous membranes are moist. No oral lesions.     Pharynx: Posterior oropharyngeal erythema present. No pharyngeal swelling, oropharyngeal exudate or uvula swelling.     Tonsils: No tonsillar exudate or tonsillar abscesses. Swelling: 0 on the right. 0 on the left.     Comments: Posterior oropharyngeal erythema Eyes:     Conjunctiva/sclera: Conjunctivae normal.     Pupils: Pupils are equal, round, and reactive to light.  Neck:     Musculoskeletal: Normal range of motion and neck supple.     Thyroid: No thyromegaly.  Cardiovascular:     Rate and Rhythm: Normal rate and regular rhythm.     Heart sounds: Normal heart sounds.  Lymphadenopathy:     Cervical: No cervical adenopathy.  Skin:    General: Skin is warm and dry.     Findings: No rash.  Neurological:     General: No focal deficit present.     Mental Status: She is alert and oriented to person, place, and time.     Laboratory Strep test done. Results:negative.    Assessment:    Acute pharyngitis, likely  Viral pharyngitis.    Plan:   Exam findings, diagnosis etiology and medication use and indications reviewed with patient. Follow- Up and discharge instructions provided. No emergent/urgent issues found on exam.  Based on the patient's clinical presentation, symptoms, duration of  symptoms and physical exam, patient's symptoms are congruent with that of viral etiology.  Explained to patient that I felt she needed a throat culture at this time.  Also informed patient that her symptoms have only persisted for 5 days which are consistent with that of viral etiology.  Instructed patient that she could add the symptomatic treatment of lidocaine mouthwash to help with throat pain.  Patient also instructed that she can continue her ibuprofen and home remedies  to help with the pain.  Instructed patient to follow-up with her PCP for the throat culture if she could get an appointment.  Patient education was provided. Patient verbalized understanding of information provided and agrees with plan of care (POC), all questions answered. The patient is advised to call or return to clinic if condition does not see an improvement in symptoms, or to seek the care of the closest emergency department if condition worsens with the above plan.   1. Sore throat  - POCT rapid strep A  2. Pharyngitis, unspecified etiology  - lidocaine (XYLOCAINE) 2 % solution; Use as directed 5 mLs in the mouth or throat every 6 (six) hours as needed for up to 5 days for mouth pain.  Dispense: 100 mL; Refill: 0 Take medication as prescribed. -Ibuprofen or Tylenol for pain, fever, or general discomfort. -Increase fluids. -Warm salt water gargles 3-4 times daily as needed for throat pain. -May use a teaspoon of honey or over-the-counter cough drops to help with throat pain. -Based on your continued symptoms, and negative strep test, you most likely need a throat culture at this time.  I suggest going to your primary care physician, or either the Urgent Care @ [facility].

## 2018-10-27 NOTE — Patient Instructions (Signed)
Pharyngitis -Take medication as prescribed. -Ibuprofen or Tylenol for pain, fever, or general discomfort. -Increase fluids. -Warm salt water gargles 3-4 times daily as needed for throat pain. -May use a teaspoon of honey or over-the-counter cough drops to help with throat pain. -Based on your continued symptoms, and negative strep test, you most likely need a throat culture at this time.  I suggest going to your primary care physician, or [facility].  Pharyngitis is redness, pain, and swelling (inflammation) of the throat (pharynx). It is a very common cause of sore throat. Pharyngitis can be caused by a bacteria, but it is usually caused by a virus. Most cases of pharyngitis get better on their own without treatment. What are the causes? This condition may be caused by:  Infection by viruses (viral). Viral pharyngitis spreads from person to person (is contagious) through coughing, sneezing, and sharing of personal items or utensils such as cups, forks, spoons, and toothbrushes.  Infection by bacteria (bacterial). Bacterial pharyngitis may be spread by touching the nose or face after coming in contact with the bacteria, or through more intimate contact, such as kissing.  Allergies. Allergies can cause buildup of mucus in the throat (post-nasal drip), leading to inflammation and irritation. Allergies can also cause blocked nasal passages, forcing breathing through the mouth, which dries and irritates the throat. What increases the risk? You are more likely to develop this condition if:  You are 655-35 years old.  You are exposed to crowded environments such as daycare, school, or dormitory living.  You live in a cold climate.  You have a weakened disease-fighting (immune) system. What are the signs or symptoms? Symptoms of this condition vary by the cause (viral, bacterial, or allergies) and can include:  Sore throat.  Fatigue.  Low-grade  fever.  Headache.  Joint pain and muscle aches.  Skin rashes.  Swollen glands in the throat (lymph nodes).  Plaque-like film on the throat or tonsils. This is often a symptom of bacterial pharyngitis.  Vomiting.  Stuffy nose (nasal congestion).  Cough.  Red, itchy eyes (conjunctivitis).  Loss of appetite. How is this diagnosed? This condition is often diagnosed based on your medical history and a physical exam. Your health care provider will ask you questions about your illness and your symptoms. A swab of your throat may be done to check for bacteria (rapid strep test). Other lab tests may also be done, depending on the suspected cause, but these are rare. How is this treated? This condition usually gets better in 3-4 days without medicine. Bacterial pharyngitis may be treated with antibiotic medicines. Follow these instructions at home:  Take over-the-counter and prescription medicines only as told by your health care provider. ? If you were prescribed an antibiotic medicine, take it as told by your health care provider. Do not stop taking the antibiotic even if you start to feel better. ? Do not give children aspirin because of the association with Reye syndrome.  Drink enough water and fluids to keep your urine clear or pale yellow.  Get a lot of rest.  Gargle with a salt-water mixture 3-4 times a day or as needed. To make a salt-water mixture, completely dissolve -1 tsp of salt in 1 cup of warm water.  If your health care provider approves, you may use throat lozenges or sprays to soothe your throat. Contact a health care provider if:  You have large, tender lumps in your neck.  You have a rash.  You cough up green, yellow-brown, or bloody spit. Get help right away if:  Your neck becomes stiff.  You drool or are unable to swallow liquids.  You cannot drink or take medicines without vomiting.  You have severe pain that does not go away, even after you take  medicine.  You have trouble breathing, and it is not caused by a stuffy nose.  You have new pain and swelling in your joints such as the knees, ankles, wrists, or elbows. Summary  Pharyngitis is redness, pain, and swelling (inflammation) of the throat (pharynx).  While pharyngitis can be caused by a bacteria, the most common causes are viral.  Most cases of pharyngitis get better on their own without treatment.  Bacterial pharyngitis is treated with antibiotic medicines. This information is not intended to replace advice given to you by your health care provider. Make sure you discuss any questions you have with your health care provider. Document Released: 09/28/2005 Document Revised: 11/03/2016 Document Reviewed: 11/03/2016 Elsevier Interactive Patient Education  2019 [employer].

## 2018-10-31 ENCOUNTER — Ambulatory Visit (INDEPENDENT_AMBULATORY_CARE_PROVIDER_SITE_OTHER): Payer: Self-pay | Admitting: Nurse Practitioner

## 2018-10-31 VITALS — BP 105/70 | HR 95 | Temp 97.9°F | Resp 18 | Wt 241.8 lb

## 2018-10-31 DIAGNOSIS — J069 Acute upper respiratory infection, unspecified: Secondary | ICD-10-CM

## 2018-10-31 MED ORDER — FLUCONAZOLE 150 MG PO TABS: 150.0000 mg | ORAL_TABLET | Freq: Once | ORAL | 0 refills | Status: AC

## 2018-10-31 MED ORDER — PSEUDOEPH-BROMPHEN-DM 30-2-10 MG/5ML PO SYRP: 5.0000 mL | ORAL_SOLUTION | Freq: Four times a day (QID) | ORAL | 0 refills | Status: AC | PRN

## 2018-10-31 MED ORDER — AZITHROMYCIN 250 MG PO TABS: ORAL_TABLET | ORAL | 0 refills | Status: DC

## 2018-10-31 MED FILL — BROMPHENIR-PSEUDOEPHED-DM S: 30-2-10 | 7 days supply | Qty: 150 | Fill #0

## 2018-10-31 MED FILL — AZITHROMYCIN 250 MG TABLET: 250 | 5 days supply | Qty: 6 | Fill #0

## 2018-10-31 MED FILL — FLUCONAZOLE 150 MG TABS: 150 | 9 days supply | Qty: 3 | Fill #0

## 2018-10-31 NOTE — Patient Instructions (Signed)
Upper Respiratory Infection, Adult -Take medication as prescribed. -Ibuprofen or Tylenol for pain, fever, or general discomfort. -Increase fluids. -Sleep elevated on at least 2 pillows at bedtime to help with cough. -Use a humidifier or vaporizer when at home and during sleep to help with cough. -May use a teaspoon of honey or over-the-counter cough drops to help with cough. -Follow-up with your regular doctor if your symptoms do not improve.  An upper respiratory infection (URI) is a common viral infection of the nose, throat, and upper air passages that lead to the lungs. The most common type of URI is the common cold. URIs usually get better on their own, without medical treatment. What are the causes? A URI is caused by a virus. You may catch a virus by:  Breathing in droplets from an infected person's cough or sneeze.  Touching something that has been exposed to the virus (contaminated) and then touching your mouth, nose, or eyes. What increases the risk? You are more likely to get a URI if:  You are very young or very old.  It is autumn or winter.  You have close contact with others, such as at a daycare, school, or health care facility.  You smoke.  You have long-term (chronic) heart or lung disease.  You have a weakened disease-fighting (immune) system.  You have nasal allergies or asthma.  You are experiencing a lot of stress.  You work in an area that has poor air circulation.  You have poor nutrition. What are the signs or symptoms? A URI usually involves some of the following symptoms:  Runny or stuffy (congested) nose.  Sneezing.  Cough.  Sore throat.  Headache.  Fatigue.  Fever.  Loss of appetite.  Pain in your forehead, behind your eyes, and over your cheekbones (sinus pain).  Muscle aches.  Redness or irritation of the eyes.  Pressure in the ears or face. How is this diagnosed? This condition may be diagnosed based on your medical  history and symptoms, and a physical exam. Your health care provider may use a cotton swab to take a mucus sample from your nose (nasal swab). This sample can be tested to determine what virus is causing the illness. How is this treated? URIs usually get better on their own within 7-10 days. You can take steps at home to relieve your symptoms. Medicines cannot cure URIs, but your health care provider may recommend certain medicines to help relieve symptoms, such as:  Over-the-counter cold medicines.  Cough suppressants. Coughing is a type of defense against infection that helps to clear the respiratory system, so take these medicines only as recommended by your health care provider.  Fever-reducing medicines. Follow these instructions at home: Activity  Rest as needed.  If you have a fever, stay home from work or school until your fever is gone or until your health care provider says you are no longer contagious. Your health care provider may have you wear a face mask to prevent your infection from spreading. Relieving symptoms  Gargle with a salt-water mixture 3-4 times a day or as needed. To make a salt-water mixture, completely dissolve -1 tsp of salt in 1 cup of warm water.  Use a cool-mist humidifier to add moisture to the air. This can help you breathe more easily. Eating and drinking   Drink enough fluid to keep your urine pale yellow.  Eat soups and other clear broths. General instructions   Take over-the-counter and prescription medicines only as told by  your health care provider. These include cold medicines, fever reducers, and cough suppressants.  Do not use any products that contain nicotine or tobacco, such as cigarettes and e-cigarettes. If you need help quitting, ask your health care provider.  Stay away from secondhand smoke.  Stay up to date on all immunizations, including the yearly (annual) flu vaccine.  Keep all follow-up visits as told by your health care  provider. This is important. How to prevent the spread of infection to others   URIs can be passed from person to person (are contagious). To prevent the infection from spreading: ? Wash your hands often with soap and water. If soap and water are not available, use hand sanitizer. ? Avoid touching your mouth, face, eyes, or nose. ? Cough or sneeze into a tissue or your sleeve or elbow instead of into your hand or into the air. Contact a health care provider if:  You are getting worse instead of better.  You have a fever or chills.  Your mucus is brown or red.  You have yellow or brown discharge coming from your nose.  You have pain in your face, especially when you bend forward.  You have swollen neck glands.  You have pain while swallowing.  You have white areas in the back of your throat. Get help right away if:  You have shortness of breath that gets worse.  You have severe or persistent: ? Headache. ? Ear pain. ? Sinus pain. ? Chest pain.  You have chronic lung disease along with any of the following: ? Wheezing. ? Prolonged cough. ? Coughing up blood. ? A change in your usual mucus.  You have a stiff neck.  You have changes in your: ? Vision. ? Hearing. ? Thinking. ? Mood. Summary  An upper respiratory infection (URI) is a common infection of the nose, throat, and upper air passages that lead to the lungs.  A URI is caused by a virus.  URIs usually get better on their own within 7-10 days.  Medicines cannot cure URIs, but your health care provider may recommend certain medicines to help relieve symptoms. This information is not intended to replace advice given to you by your health care provider. Make sure you discuss any questions you have with your health care provider. Document Released: 03/24/2001 Document Revised: 05/14/2017 Document Reviewed: 05/14/2017 Elsevier Interactive Patient Education  2019 [employer].

## 2018-10-31 NOTE — Progress Notes (Signed)
Subjective:    Patient ID: Emily Miller, female    DOB: 1986-04-07, 33 y.o.   MRN: [id number]  The patient is a 33 year old female who presents today for complaints of cough, headache, and chest tightness.  Patient states that over the last 2 days she has developed a productive cough, fatigue, chest congestion, and cough.  Patient states cough is productive with yellowish-green sputum.  The patient has been seen in our office twice over the last 2 weeks with complaints of sore throat. Strept tests were performed at each visit and were negative.   During the last visit on 10/27/2018,  patient was informed to follow-up with her PCP for a throat culture.  Patient returns today stating the culture was "negative". Patient continues to state that her throat is sore. The patient states she has been wheezing with the cough and the cough wakes her from sleep.  Patient states she has a history of asthma and her asthma symptoms exacerbated with upper respiratory infections.  States she has been taking Alka-Seltzer, NyQuil, and DayQuil for her symptoms with little relief.  She also states she has been using her inhaler over the last 2 days with good relief.  Cough  The problem has been gradually worsening. The cough is productive of sputum. Associated symptoms include chills, a fever, headaches, a sore throat, shortness of breath and wheezing. Pertinent negatives include no chest pain, ear congestion, ear pain or nasal congestion. Associated symptoms comments: Hot and cold spells but temperature not measured at home.. The symptoms are aggravated by lying down. She has tried OTC cough suppressant for the symptoms. The treatment provided no relief. There is no history of pneumonia.   Past Medical History:  Diagnosis Date  . Asthma      Review of Systems  Constitutional: Positive for activity change, appetite change, chills, fatigue and fever.  HENT: Positive for sore throat. Negative for ear discharge, ear  pain, sinus pressure and sinus pain.   Eyes: Negative.   Respiratory: Positive for cough, chest tightness, shortness of breath and wheezing. Negative for choking and stridor.   Cardiovascular: Negative.  Negative for chest pain.  Gastrointestinal: Negative.   Skin: Negative.   Neurological: Positive for weakness and headaches. Negative for dizziness, facial asymmetry, light-headedness and numbness.       Objective:  Blood pressure 105/70, pulse 95, temperature 97.9 F (36.6 C), temperature source Oral, resp. rate 18, weight 241 lb 12.8 oz (109.7 kg), SpO2 98 %.    Physical Exam Vitals signs reviewed.  Constitutional:      General: She is not in acute distress.    Appearance: She is well-developed.     Comments: Appears fatigued  HENT:     Head: Normocephalic.     Right Ear: Tympanic membrane and ear canal normal.     Left Ear: Tympanic membrane and ear canal normal.     Nose: Congestion present.     Mouth/Throat:     Mouth: Mucous membranes are moist.     Pharynx: No pharyngeal swelling, oropharyngeal exudate, posterior oropharyngeal erythema or uvula swelling.     Tonsils: No tonsillar exudate. Swelling: 0 on the right. 0 on the left.  Neck:     Musculoskeletal: Normal range of motion and neck supple. No neck rigidity.  Cardiovascular:     Rate and Rhythm: Normal rate and regular rhythm.     Heart sounds: Normal heart sounds.  Pulmonary:     Effort: Pulmonary effort is normal. No  respiratory distress.     Breath sounds: Normal breath sounds. No wheezing, rhonchi or rales.  Chest:     Chest wall: No tenderness.  Abdominal:     General: There is no distension.     Palpations: Abdomen is soft.     Tenderness: There is no abdominal tenderness.  Lymphadenopathy:     Cervical: No cervical adenopathy.  Skin:    General: Skin is warm and dry.     Capillary Refill: Capillary refill takes less than 2 seconds.  Neurological:     General: No focal deficit present.     Mental  Status: She is alert and oriented to person, place, and time.  Psychiatric:        Mood and Affect: Mood normal.       Assessment & Plan:   Exam findings, diagnosis etiology and medication use and indications reviewed with patient. Follow- Up and discharge instructions provided. No emergent/urgent issues found on exam.  Based on the patient's clinical presentation, symptoms, and underlying history of asthma, I am going to treat this patient with a Z-Pak.  Although I feel this patient's symptoms are of viral etiology due to the absence of fever, no respiratory distress on exam, and the duration of symptoms, patient was  insistent  that she needs an antibiotic, which is also demonstrated by the number of visits in our office over the last 2 weeks..  There is no concern for pneumonia or influenza at this time.  Patient has had 3 negative strep tests over the last 2 to 3 weeks.  Attempted to discuss the untoward effects of antibiotics when they are used inappropriately with the patient.  I will provide the patient with symptomatic treatment for her cough as well to include Bromfed.  Patient was instructed if her symptoms do not improve with this treatment regimen, I would like her to follow-up with her PCP.  Patient education was provided. Patient verbalized understanding of information provided and agrees with plan of care (POC), all questions answered. The patient is advised to call or return to clinic if condition does not see an improvement in symptoms, or to seek the care of the closest emergency department if condition worsens with the above plan.   1. Upper respiratory tract infection, unspecified type  - azithromycin (ZITHROMAX) 250 MG tablet; Take as directed.  Dispense: 6 tablet; Refill: 0 - brompheniramine-pseudoephedrine-DM 30-2-10 MG/5ML syrup; Take 5 mLs by mouth 4 (four) times daily as needed for up to 7 days.  Dispense: 150 mL; Refill: 0 -Take medication as prescribed. -Ibuprofen or  Tylenol for pain, fever, or general discomfort. -Increase fluids. -Sleep elevated on at least 2 pillows at bedtime to help with cough. -Use a humidifier or vaporizer when at home and during sleep to help with cough. -May use a teaspoon of honey or over-the-counter cough drops to help with cough. -Follow-up with your regular doctor if your symptoms do not improve.

## 2018-11-30 MED FILL — BLISOVI FE 1/20 1-20 MG-MCG: 1-20 | 84 days supply | Qty: 84 | Fill #3

## 2019-01-24 MED FILL — ETODOLAC 400 MG TABLET: 400 | 90 days supply | Qty: 180 | Fill #0

## 2019-02-01 MED FILL — NABUMETONE 500 MG TABS: 500 | 30 days supply | Qty: 60 | Fill #0 | Status: TO

## 2019-02-24 MED FILL — BLISOVI FE 1/20 1-20 MG-MCG: 1-20 | 84 days supply | Qty: 84 | Fill #0

## 2019-03-09 MED FILL — HYDROXYCHLOROQUINE SULFATE: 200 | 30 days supply | Qty: 60 | Fill #0

## 2019-03-16 MED FILL — NABUMETONE 500 MG TABLET: 500 | 30 days supply | Qty: 60 | Fill #0

## 2019-04-08 MED FILL — HYDROXYCHLOROQUINE SULFATE: 200 | 30 days supply | Qty: 60 | Fill #0

## 2019-04-17 MED FILL — NABUMETONE 500 MG TABLET: 500 | 30 days supply | Qty: 60 | Fill #0

## 2019-05-05 MED FILL — HYDROXYCHLOROQUINE SULFATE: 200 | 30 days supply | Qty: 60 | Fill #0

## 2019-05-05 MED FILL — BLISOVI FE 1/20 1-20 MG-MCG: 1-20 | 84 days supply | Qty: 84 | Fill #0

## 2019-05-15 MED FILL — NABUMETONE 500 MG TABLET: 500 | 30 days supply | Qty: 60 | Fill #1

## 2019-06-08 MED FILL — HYDROXYCHLOROQUINE SULFATE: 200 | 30 days supply | Qty: 60 | Fill #1

## 2019-06-16 MED FILL — NABUMETONE 500 MG TABLET: 500 | 30 days supply | Qty: 60 | Fill #0

## 2019-07-06 MED FILL — HYDROXYCHLOROQUINE SULFATE: 200 | 30 days supply | Qty: 60 | Fill #2

## 2019-07-13 MED FILL — MAGIC MOUTHWASH 1:1:1: 9 days supply | Qty: 180 | Fill #0

## 2019-07-24 MED FILL — NABUMETONE 500 MG TABLET: 500 | 30 days supply | Qty: 60 | Fill #0

## 2019-08-01 MED FILL — BLISOVI FE 1/20 1-20 MG-MCG: 1-20 | 84 days supply | Qty: 84 | Fill #1

## 2019-08-04 MED FILL — HYDROXYCHLOROQUINE SULFATE: 200 | 30 days supply | Qty: 60 | Fill #3

## 2019-08-24 MED FILL — NABUMETONE 500 MG TABLET: 500 | 30 days supply | Qty: 60 | Fill #1

## 2019-09-01 MED FILL — HYDROXYCHLOROQUINE SULFATE: 200 | 30 days supply | Qty: 60 | Fill #4

## 2019-10-02 MED FILL — HYDROXYCHLOROQUINE SULFATE: 200 | 90 days supply | Qty: 180 | Fill #0

## 2019-10-19 MED FILL — BLISOVI FE 1/20 1-20 MG-MCG: 1-20 | 84 days supply | Qty: 84 | Fill #2

## 2019-11-15 ENCOUNTER — Ambulatory Visit: Payer: No Typology Code available for payment source | Attending: Internal Medicine

## 2019-11-21 NOTE — Telephone Encounter (Signed)
Error

## 2020-01-01 MED FILL — HYDROXYCHLOROQUINE 200 MG T: 200 | 90 days supply | Qty: 180 | Fill #0

## 2020-01-16 MED FILL — BLISOVI FE 1/20 1-20 MG-MCG: 1-20 | 84 days supply | Qty: 84 | Fill #3

## 2020-02-08 MED FILL — AMOXICILLIN 875 MG TABS: 875 | 3 days supply | Qty: 6 | Fill #0

## 2020-02-08 MED FILL — OXYCODONE-ACETAMINOPHEN 5-3: 5-325 | 2 days supply | Qty: 10 | Fill #0

## 2020-02-16 MED FILL — OZEMPIC 0.25 OR 0.5 MG/DOSE: 2 | 28 days supply | Qty: 2 | Fill #0

## 2020-03-25 MED FILL — HYDROXYCHLOROQUINE SULFATE: 200 | 90 days supply | Qty: 180 | Fill #0

## 2020-03-26 MED FILL — BLISOVI FE 1/20 1-20 MG-MCG: 1-20 | 28 days supply | Qty: 28 | Fill #4

## 2020-04-08 ENCOUNTER — Other Ambulatory Visit (HOSPITAL_COMMUNITY): Payer: Self-pay | Admitting: Family Medicine

## 2020-04-08 MED FILL — TRI FEMYNOR 28 TABLET: 0.18/0.215/ | 84 days supply | Qty: 84 | Fill #0

## 2020-05-09 MED FILL — OZEMPIC 1 MG/DOSE SOPN: 2 | 28 days supply | Qty: 3 | Fill #0

## 2020-05-17 MED FILL — ZOLPIDEM TARTRATE 5 MG TAB: 5 | 10 days supply | Qty: 10 | Fill #0

## 2020-06-01 ENCOUNTER — Encounter: Payer: Self-pay | Admitting: Oncology

## 2020-06-01 ENCOUNTER — Other Ambulatory Visit (HOSPITAL_COMMUNITY): Payer: Self-pay | Admitting: Oncology

## 2020-06-01 ENCOUNTER — Ambulatory Visit (HOSPITAL_COMMUNITY)
Admission: RE | Admit: 2020-06-01 | Discharge: 2020-06-01 | Disposition: A | Discharge status: Home / Self Care | Payer: No Typology Code available for payment source | Source: Ambulatory Visit | Attending: Pulmonary Disease | Admitting: Pulmonary Disease

## 2020-06-01 DIAGNOSIS — E669 Obesity, unspecified: Secondary | ICD-10-CM | POA: Diagnosis not present

## 2020-06-01 DIAGNOSIS — U071 COVID-19: Secondary | ICD-10-CM | POA: Insufficient documentation

## 2020-06-01 DIAGNOSIS — J45909 Unspecified asthma, uncomplicated: Secondary | ICD-10-CM | POA: Diagnosis not present

## 2020-06-01 DIAGNOSIS — M069 Rheumatoid arthritis, unspecified: Secondary | ICD-10-CM | POA: Insufficient documentation

## 2020-06-01 MED ORDER — SODIUM CHLORIDE 0.9 % IV SOLN: INTRAVENOUS | Status: DC | PRN

## 2020-06-01 MED ORDER — DIPHENHYDRAMINE HCL 50 MG/ML IJ SOLN: 50.0000 mg | Freq: Once | INTRAMUSCULAR | Status: DC | PRN

## 2020-06-01 MED ORDER — EPINEPHRINE 0.3 MG/0.3ML IJ SOAJ: 0.3000 mg | Freq: Once | INTRAMUSCULAR | Status: DC | PRN

## 2020-06-01 MED ORDER — FAMOTIDINE IN NACL 20-0.9 MG/50ML-% IV SOLN: 20.0000 mg | Freq: Once | INTRAVENOUS | Status: DC | PRN

## 2020-06-01 MED ORDER — METHYLPREDNISOLONE SODIUM SUCC 125 MG IJ SOLR: 125.0000 mg | Freq: Once | INTRAMUSCULAR | Status: DC | PRN

## 2020-06-01 MED ORDER — ALBUTEROL SULFATE HFA 108 (90 BASE) MCG/ACT IN AERS: 2.0000 | INHALATION_SPRAY | Freq: Once | RESPIRATORY_TRACT | Status: DC | PRN

## 2020-06-01 MED ORDER — SODIUM CHLORIDE 0.9 % IV SOLN
1200.0000 mg | Freq: Once | INTRAVENOUS | Status: AC
  Administered 2020-06-01: 1200 mg via INTRAVENOUS
  Filled 2020-06-01: qty 10

## 2020-06-01 MED FILL — ALBUTEROL SULFATE HFA 108 (: 108 (90 BAS | 16 days supply | Qty: 18 | Fill #0

## 2020-06-01 NOTE — Discharge Instructions (Signed)

## 2020-06-01 NOTE — Progress Notes (Signed)
°  Diagnosis: COVID-19 ° °Physician: Patrick Wright, MD ° °Procedure: [facility] Med: casirivimab\imdevimab infusion - Provided patient with casirivimab\imdevimab fact sheet for patients, parents and caregivers prior to infusion. ° °Complications: No immediate complications noted. ° °Discharge: Discharged home  ° °Connie Lasater N Aydyn Testerman °06/01/2020 ° °

## 2020-06-01 NOTE — Progress Notes (Signed)
I connected by phone with Emily Miller to discuss the potential use of an new treatment for mild to moderate COVID-19 viral infection in non-hospitalized patients.    This patient is a age/sex that meets the FDA criteria for Emergency Use Authorization of casirivimab\imdevimab.  Has a (+) direct SARS-CoV-2 viral test result 1. Has mild or moderate COVID-19  2. Is ? 36 years of age and weighs ? 40 kg 3. Is NOT hospitalized due to COVID-19 4. Is NOT requiring oxygen therapy or requiring an increase in baseline oxygen flow rate due to COVID-19 5. Is within 10 days of symptom onset 6. Has at least one of the high risk factor(s) for progression to severe COVID-19 and/or hospitalization as defined in EUA. ? Specific high risk criteria : obesity, asthma, RA    Symptom onset  05/29/2020   I have spoken and communicated the following to the patient or parent/caregiver:   1. FDA has authorized the emergency use of casirivimab\imdevimab for the treatment of mild to moderate COVID-19 in adults and pediatric patients with positive results of direct SARS-CoV-2 viral testing who are 56 years of age and older weighing at least 40 kg, and who are at high risk for progressing to severe COVID-19 and/or hospitalization.   2. The significant known and potential risks and benefits of casirivimab\imdevimab, and the extent to which such potential risks and benefits are unknown.   3. Information on available alternative treatments and the risks and benefits of those alternatives, including clinical trials.   4. Patients treated with casirivimab\imdevimab should continue to self-isolate and use infection control measures (e.g., wear mask, isolate, social distance, avoid sharing personal items, clean and disinfect "high touch" surfaces, and frequent handwashing) according to CDC guidelines.    5. The patient or parent/caregiver has the option to accept or refuse casirivimab\imdevimab .   After reviewing this  information with the patient, The patient agreed to proceed with receiving casirivimab\imdevimab infusion and will be provided a copy of the Fact sheet prior to receiving the infusion.Emily Miller, AGNP-C [phone] (Infusion Center Hotline)

## 2020-06-10 MED FILL — OZEMPIC 1 MG/DOSE SOPN: 2 | 28 days supply | Qty: 3 | Fill #1

## 2020-07-02 MED FILL — OZEMPIC (1 MG/DOSE) 4 MG/3M: 4 | 84 days supply | Qty: 9 | Fill #0

## 2020-07-02 MED FILL — TRI FEMYNOR 28 TABLET: 0.18/0.215/ | 84 days supply | Qty: 84 | Fill #1

## 2020-07-15 ENCOUNTER — Other Ambulatory Visit (HOSPITAL_COMMUNITY): Payer: Self-pay | Admitting: Rheumatology

## 2020-07-15 MED FILL — HYDROXYCHLOROQUINE SULFATE: 200 | 90 days supply | Qty: 180 | Fill #0

## 2020-07-24 ENCOUNTER — Encounter: Payer: Self-pay | Admitting: Certified Nurse Midwife

## 2020-07-24 ENCOUNTER — Other Ambulatory Visit: Payer: Self-pay

## 2020-07-24 ENCOUNTER — Ambulatory Visit: Payer: No Typology Code available for payment source | Admitting: Certified Nurse Midwife

## 2020-07-24 VITALS — BP 109/66 | HR 97 | Temp 98.0°F | Ht 68.0 in | Wt 215.8 lb

## 2020-07-24 DIAGNOSIS — Z30019 Encounter for initial prescription of contraceptives, unspecified: Secondary | ICD-10-CM

## 2020-07-24 DIAGNOSIS — Z01419 Encounter for gynecological examination (general) (routine) without abnormal findings: Secondary | ICD-10-CM | POA: Diagnosis not present

## 2020-07-24 MED ORDER — NORGESTIMATE-ETH ESTRADIOL 0.25-35 MG-MCG PO TABS: 1.0000 | ORAL_TABLET | Freq: Every day | ORAL | 11 refills | Status: DC

## 2020-07-24 MED FILL — FEMYNOR 0.25-35 MG-MCG TABS: 0.25-35 | 28 days supply | Qty: 28 | Fill #0

## 2020-07-24 NOTE — Progress Notes (Signed)
GYNECOLOGY CLINIC ANNUAL PREVENTATIVE CARE ENCOUNTER NOTE  Subjective:   Emily Miller is a 34 y.o. G102P3010 female here for a routine annual gynecologic exam.  Current complaints: Dry eyes associated with oral contraceptives.  Was having heavy menstrual bleeding and increased frequency of menses, that was fixed with use of OCPs and a daily capsule of red raspberry leaf. 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 Is prescribed Ambien but prefers non-pharmacological methods first. No other complaints. Denies discharge, pelvic pain, problems with intercourse or other gynecologic concerns.    Gynecologic History Patient's last menstrual period was 07/10/2020 (within days). Contraception: OCP (estrogen/progesterone) Last Pap: 2019. Results were: normal Last mammogram: N/A. Results were: N/A  Obstetric History OB History  Gravida Para Term Preterm AB Living  4 3 3  0 1 0  SAB TAB Ectopic Multiple Live Births  1 0 0 0 0    # Outcome Date GA Lbr Len/2nd Weight Sex Delivery Anes PTL Lv  4 SAB           3 Term           2 Term           1 Term             Past Medical History:  Diagnosis Date  . Asthma    As a child. Not treated daily as an adult    Past Surgical History:  Procedure Laterality Date  . TONSILECTOMY/ADENOIDECTOMY WITH MYRINGOTOMY      Current Outpatient Medications on File Prior to Visit  Medication Sig Dispense Refill  . etodolac (LODINE) 400 MG tablet Take 400 mg by mouth 2 (two) times daily.    . Vitamin D, Ergocalciferol, (DRISDOL) 50000 units CAPS capsule Take 50,000 Units by mouth every 7 (seven) days.     No current facility-administered medications on file prior to visit.    No Known Allergies  Social History   Socioeconomic History  . Marital status: Married    Spouse name: Not on file  . Number of children: Not on file  . Years of education: Not on file  . Highest education level: Master's degree (e.g., MA, MS, MEng,  MEd, MSW, MBA)  Occupational History  . Not on file  Tobacco Use  . Smoking status: Never Smoker  . Smokeless tobacco: Never Used  Vaping Use  . Vaping Use: Never used  Substance and Sexual Activity  . Alcohol use: Yes    Comment: soically  . Drug use: Never  . Sexual activity: Yes    Birth control/protection: Pill  Other Topics Concern  . Not on file  Social History Narrative  . Not on file   Social Determinants of Health   Financial Resource Strain:   . Difficulty of Paying Living Expenses: Not on file  Food Insecurity:   . Worried About in the Last Year: Not on file  . Ran Out of Food in the Last Year: Not on file  Transportation Needs:   . Lack of Transportation (Medical): Not on file  . Lack of Transportation (Non-Medical): Not on file  Physical Activity:   . Days of Exercise per Week: Not on file  . Minutes of Exercise per Session: Not on file  Stress:   . Feeling of Stress : Not on file  Social Connections:   . Frequency of Communication with Friends and Family: Not on file  . Frequency of Social  Gatherings with Friends and Family: Not on file  . Attends Religious Services: Not on file  . Active Member of Clubs or Organizations: Not on file  . Attends Banker Meetings: Not on file  . Marital Status: Not on file  Intimate Partner Violence:   . Fear of Current or Ex-Partner: Not on file  . Emotionally Abused: Not on file  . Physically Abused: Not on file  . Sexually Abused: Not on file   The following portions of the patient's history were reviewed and updated as appropriate: allergies, current medications, past family history, past medical history, past social history, past surgical history and problem list.  Review of Systems Pertinent items noted in HPI and remainder of comprehensive ROS otherwise negative.   Objective:  BP 109/66 (BP Location: Left Arm, Patient Position: Sitting, Cuff Size: Large)   Pulse 97   Temp 98 F  (36.7 C) (Oral)   Ht 5\' 8"  (1.727 m)   Wt 215 lb 12.8 oz (97.9 kg)   LMP 07/10/2020 (Within Days)   BMI 32.81 kg/m  CONSTITUTIONAL: Well-developed, well-nourished female in no acute distress.  HENT:  Normocephalic, atraumatic, External right and left ear normal. Oropharynx is clear and moist EYES: Conjunctivae and EOM are normal. Pupils are equal, round, and reactive to light. No scleral icterus.  NECK: Normal range of motion, supple, no masses.  Normal thyroid.  SKIN: Skin is warm and dry. No rash noted. Not diaphoretic. No erythema. No pallor. NEUROLGIC: Alert and oriented to person, place, and time. Normal reflexes, muscle tone coordination. No cranial nerve deficit noted. PSYCHIATRIC: Normal mood and affect. Normal behavior. Normal judgment and thought content. CARDIOVASCULAR: Normal heart rate noted, regular rhythm RESPIRATORY: Clear to auscultation bilaterally. Effort and breath sounds normal, no problems with respiration noted. BREASTS: Symmetric in size. No masses, skin changes, nipple drainage, or lymphadenopathy. ABDOMEN: Soft, normal bowel sounds, no distention noted.  No tenderness, rebound or guarding.  PELVIC: Deferred MUSCULOSKELETAL: Normal range of motion. No tenderness.  No cyanosis, clubbing, or edema.  2+ distal pulses.  Assessment:  Annual gynecologic examination Encounter for birth control counseling   Plan:  To try magnesium gummies, a melatonin blend, and ashwaghanda for more restful sleep Switching to Sprintec and adding daily oral evening primrose oil to see if it provides additional relief for her dry eyes Routine preventative health maintenance measures emphasized. Follow up in one year for annual gyn with pap or PRN  07/12/2020, CNM, MSN, [facility] 07/24/20 11:49 AM

## 2020-07-24 NOTE — Patient Instructions (Addendum)
For sleep assistance: Magnesium gummies 1-3 (Calm brand works well) Olly Restful gummies (has melatonin, vitamin D and tart cherry) Ashwaghanda gummies or capsules (Goli are my favorite)  For dry eyes: - Switching to Sprintec - Start using evening primrose oil capsules daily

## 2020-08-07 ENCOUNTER — Other Ambulatory Visit (HOSPITAL_COMMUNITY): Payer: Self-pay | Admitting: Internal Medicine

## 2020-08-30 MED FILL — FEMYNOR 0.25-35 MG-MCG TABS: 0.25-35 | 28 days supply | Qty: 28 | Fill #1

## 2020-09-02 MED FILL — WEGOVY 1.7 MG/0.75ML SOAJ: 1.7 | 28 days supply | Qty: 3 | Fill #0

## 2020-09-27 MED FILL — WEGOVY 1.7 MG/0.75ML SOAJ: 1.7 | 28 days supply | Qty: 3 | Fill #1

## 2020-10-06 ENCOUNTER — Ambulatory Visit (INDEPENDENT_AMBULATORY_CARE_PROVIDER_SITE_OTHER): Payer: No Typology Code available for payment source

## 2020-10-06 ENCOUNTER — Emergency Department: Admit: 2020-10-06 | Discharge status: Absent without leave | Payer: Self-pay

## 2020-10-06 ENCOUNTER — Other Ambulatory Visit: Payer: Self-pay

## 2020-10-06 ENCOUNTER — Ambulatory Visit
Admission: EM | Admit: 2020-10-06 | Discharge: 2020-10-06 | Disposition: A | Discharge status: Home / Self Care | Payer: No Typology Code available for payment source | Attending: Family Medicine | Admitting: Family Medicine

## 2020-10-06 DIAGNOSIS — R5383 Other fatigue: Secondary | ICD-10-CM

## 2020-10-06 DIAGNOSIS — R062 Wheezing: Secondary | ICD-10-CM | POA: Insufficient documentation

## 2020-10-06 DIAGNOSIS — J029 Acute pharyngitis, unspecified: Secondary | ICD-10-CM | POA: Diagnosis present

## 2020-10-06 DIAGNOSIS — R519 Headache, unspecified: Secondary | ICD-10-CM | POA: Diagnosis present

## 2020-10-06 DIAGNOSIS — J069 Acute upper respiratory infection, unspecified: Secondary | ICD-10-CM | POA: Diagnosis present

## 2020-10-06 DIAGNOSIS — R059 Cough, unspecified: Secondary | ICD-10-CM | POA: Diagnosis present

## 2020-10-06 DIAGNOSIS — R0981 Nasal congestion: Secondary | ICD-10-CM | POA: Diagnosis present

## 2020-10-06 LAB — POCT RAPID STREP A (OFFICE): Rapid Strep A Screen: NEGATIVE

## 2020-10-06 MED ORDER — AZITHROMYCIN 250 MG PO TABS: 250.0000 mg | ORAL_TABLET | Freq: Every day | ORAL | 0 refills | Status: DC

## 2020-10-06 MED ORDER — FLUCONAZOLE 150 MG PO TABS: ORAL_TABLET | ORAL | 0 refills | Status: DC

## 2020-10-06 NOTE — Discharge Instructions (Addendum)
Chest xray is negative for pneumonia today  I have sent in azithromycin for you to take. Take 2 tablets today, then one tablet daily for the next 4 days.  Your COVID and Flu tests are pending.  You should self quarantine until the test results are back.    Take Tylenol or ibuprofen as needed for fever or discomfort.  Rest and keep yourself hydrated.    Follow-up with your primary care provider if your symptoms are not improving.

## 2020-10-06 NOTE — ED Triage Notes (Signed)
Pt presents with c/o sore throat  And headache for a couple weeks, had positive home covid test on 12/13 but PCR was negative

## 2020-10-08 LAB — COVID-19, FLU A+B NAA
Influenza A, NAA: NOT DETECTED
Influenza B, NAA: NOT DETECTED
SARS-CoV-2, NAA: NOT DETECTED

## 2020-10-08 NOTE — ED Provider Notes (Signed)
[facility]   [id number] 10/06/20 Arrival Time: 1516   CC: COVID symptoms  SUBJECTIVE: History from: patient.  Emily Miller is a 34 y.o. female who presents with sore throat and headaches for the last 3 weeks. Reports that she had a positive home rapid covid test on 09/23/20. Denies sick exposure to COVID, flu or strep. Denies recent travel. Has positive history of Covid. Has  completed Covid vaccines. Has taken tylenol and OTC cough and cold for this. There are no aggravating or alleviating factors. She has satisfied her quarantine requirement but still does not feel well enough to return to work. Reports that she is still very fatigued. Denies previous symptoms in the past. Denies fever, chills, sinus pain, rhinorrhea, SOB, wheezing, chest pain, nausea, changes in bowel or bladder habits.    ROS: As per HPI.  All other pertinent ROS negative.     Past Medical History:  Diagnosis Date  . Asthma    As a child. Not treated daily as an adult   Past Surgical History:  Procedure Laterality Date  . TONSILECTOMY/ADENOIDECTOMY WITH MYRINGOTOMY     No Known Allergies No current facility-administered medications on file prior to encounter.   Current Outpatient Medications on File Prior to Encounter  Medication Sig Dispense Refill  . norgestimate-ethinyl estradiol (ORTHO-CYCLEN) 0.25-35 MG-MCG tablet Take 1 tablet by mouth daily. 30 tablet 11   Social History   Socioeconomic History  . Marital status: Married    Spouse name: Not on file  . Number of children: Not on file  . Years of education: Not on file  . Highest education level: Master's degree (e.g., MA, MS, MEng, MEd, MSW, MBA)  Occupational History  . Not on file  Tobacco Use  . Smoking status: Never Smoker  . Smokeless tobacco: Never Used  Vaping Use  . Vaping Use: Never used  Substance and Sexual Activity  . Alcohol use: Yes    Comment: soically  . Drug use: Never  . Sexual activity: Yes    Birth  control/protection: Pill  Other Topics Concern  . Not on file  Social History Narrative  . Not on file   Social Determinants of Health   Financial Resource Strain: Not on file  Food Insecurity: Not on file  Transportation Needs: Not on file  Physical Activity: Not on file  Stress: Not on file  Social Connections: Not on file  Intimate Partner Violence: Not on file   Family History  Family history unknown: Yes    OBJECTIVE:  Vitals:   10/06/20 1521  BP: 117/77  Pulse: 97  Resp: 18  Temp: 98.2 F (36.8 C)  TempSrc: Oral  SpO2: 97%     General appearance: alert; appears fatigued, but nontoxic; speaking in full sentences and tolerating own secretions HEENT: NCAT; Ears: EACs clear, TMs pearly gray; Eyes: PERRL.  EOM grossly intact. Sinuses: nontender; Nose: nares patent without rhinorrhea, Throat: oropharynx mildly erythematous, cobblestoning present, tonsils non erythematous or enlarged, uvula midline  Neck: supple without LAD Lungs: unlabored respirations, symmetrical air entry; cough: absent; no respiratory distress; diminished lung sounds to bilateral bases Heart: regular rate and rhythm.  Radial pulses 2+ symmetrical bilaterally Skin: warm and dry Psychological: alert and cooperative; normal mood and affect  LABS:  No results found for this or any previous visit (from the past 24 hour(s)).   ASSESSMENT & PLAN:  1. Upper respiratory tract infection, unspecified type   2. Wheezing   3. Cough   4. Nasal  congestion   5. Nonintractable headache, unspecified chronicity pattern, unspecified headache type     Meds ordered this encounter  Medications  . azithromycin (ZITHROMAX) 250 MG tablet    Sig: Take 1 tablet (250 mg total) by mouth daily. Take first 2 tablets together, then 1 every day until finished.    Dispense:  6 tablet    Refill:  0    Order Specific Question:   Supervising Provider    Answer:   Merrilee Jansky [id number]  . fluconazole (DIFLUCAN) 150  MG tablet    Sig: Take one tablet at the onset of symptoms, if still having symptoms in 3 days, take the second tablet.    Dispense:  2 tablet    Refill:  0    Order Specific Question:   Supervising Provider    Answer:   Merrilee Jansky [id number]    Chest xray negative for pneumonia Rapid strep negative Will send for culture and inform of positive results that require further treatment Will treat for URI given length of symptoms Prescribed azithromycin Prescribed fluconazole in case of yeast Flu testing today  COVID and flutesting ordered.  It will take between 1-2 days for test results.  Someone will contact you regarding abnormal results.    Patient should remain in quarantine until they have received Covid results.  If negative you may resume normal activities (go back to work/school) while practicing hand hygiene, social distance, and mask wearing.  If positive, patient should remain in quarantine for 10 days from symptom onset AND greater than 72 hours after symptoms resolution (absence of fever without the use of fever-reducing medication and improvement in respiratory symptoms), whichever is longer Get plenty of rest and push fluids Use OTC zyrtec for nasal congestion, runny nose, and/or sore throat Use OTC flonase for nasal congestion and runny nose Use medications daily for symptom relief Use OTC medications like ibuprofen or tylenol as needed fever or pain Call or go to the ED if you have any new or worsening symptoms such as fever, worsening cough, shortness of breath, chest tightness, chest pain, turning blue, changes in mental status.  Reviewed expectations re: course of current medical issues. Questions answered. Outlined signs and symptoms indicating need for more acute intervention. Patient verbalized understanding. After Visit Summary given.         Moshe Cipro, NP 10/08/20 [phone]

## 2020-10-09 ENCOUNTER — Other Ambulatory Visit (HOSPITAL_COMMUNITY): Payer: Self-pay | Admitting: Internal Medicine

## 2020-10-09 MED FILL — WEGOVY 2.4 MG/0.75ML SOAJ: 2.4 | 28 days supply | Qty: 3 | Fill #0

## 2020-10-10 LAB — CULTURE, GROUP A STREP (THRC)

## 2020-10-16 ENCOUNTER — Ambulatory Visit (INDEPENDENT_AMBULATORY_CARE_PROVIDER_SITE_OTHER): Payer: No Typology Code available for payment source | Admitting: Family Medicine

## 2020-10-16 ENCOUNTER — Encounter (INDEPENDENT_AMBULATORY_CARE_PROVIDER_SITE_OTHER): Payer: Self-pay | Admitting: Family Medicine

## 2020-10-16 ENCOUNTER — Other Ambulatory Visit: Payer: Self-pay

## 2020-10-16 VITALS — BP 101/70 | HR 80 | Temp 98.1°F | Ht 68.0 in | Wt 203.0 lb

## 2020-10-16 DIAGNOSIS — Z9189 Other specified personal risk factors, not elsewhere classified: Secondary | ICD-10-CM

## 2020-10-16 DIAGNOSIS — Z1331 Encounter for screening for depression: Secondary | ICD-10-CM

## 2020-10-16 DIAGNOSIS — R5383 Other fatigue: Secondary | ICD-10-CM

## 2020-10-16 DIAGNOSIS — E8881 Metabolic syndrome: Secondary | ICD-10-CM

## 2020-10-16 DIAGNOSIS — R0602 Shortness of breath: Secondary | ICD-10-CM

## 2020-10-16 DIAGNOSIS — Z0289 Encounter for other administrative examinations: Secondary | ICD-10-CM

## 2020-10-16 DIAGNOSIS — E669 Obesity, unspecified: Secondary | ICD-10-CM

## 2020-10-16 DIAGNOSIS — E559 Vitamin D deficiency, unspecified: Secondary | ICD-10-CM | POA: Diagnosis not present

## 2020-10-16 DIAGNOSIS — Z6830 Body mass index (BMI) 30.0-30.9, adult: Secondary | ICD-10-CM

## 2020-10-17 LAB — COMPREHENSIVE METABOLIC PANEL
ALT: 40 IU/L — ABNORMAL HIGH (ref 0–32)
AST: 23 IU/L (ref 0–40)
Albumin/Globulin Ratio: 1.8 (ref 1.2–2.2)
Albumin: 4.3 g/dL (ref 3.8–4.8)
Alkaline Phosphatase: 81 IU/L (ref 44–121)
BUN/Creatinine Ratio: 11 (ref 9–23)
BUN: 8 mg/dL (ref 6–20)
Bilirubin Total: <0.2 mg/dL (ref 0.0–1.2)
CO2: 24 mmol/L (ref 20–29)
Calcium: 9.4 mg/dL (ref 8.7–10.2)
Chloride: 104 mmol/L (ref 96–106)
Creatinine, Ser: 0.7 mg/dL (ref 0.57–1.00)
GFR calc Af Amer: 129 mL/min/{1.73_m2} (ref >59)
GFR calc non Af Amer: 112 mL/min/{1.73_m2} (ref >59)
Globulin, Total: 2.4 g/dL (ref 1.5–4.5)
Glucose: 74 mg/dL (ref 65–99)
Potassium: 4.5 mmol/L (ref 3.5–5.2)
Sodium: 139 mmol/L (ref 134–144)
Total Protein: 6.7 g/dL (ref 6.0–8.5)

## 2020-10-17 LAB — HEMOGLOBIN A1C
Est. average glucose Bld gHb Est-mCnc: 97 mg/dL
Hgb A1c MFr Bld: 5 % (ref 4.8–5.6)

## 2020-10-17 LAB — CBC WITH DIFFERENTIAL/PLATELET
Basophils Absolute: 0 10*3/uL (ref 0.0–0.2)
Basos: 0 %
EOS (ABSOLUTE): 0 10*3/uL (ref 0.0–0.4)
Eos: 0 %
Hematocrit: 39.3 % (ref 34.0–46.6)
Hemoglobin: 13.2 g/dL (ref 11.1–15.9)
Immature Grans (Abs): 0 10*3/uL (ref 0.0–0.1)
Immature Granulocytes: 0 %
Lymphocytes Absolute: 1.7 10*3/uL (ref 0.7–3.1)
Lymphs: 23 %
MCH: 29.5 pg (ref 26.6–33.0)
MCHC: 33.6 g/dL (ref 31.5–35.7)
MCV: 88 fL (ref 79–97)
Monocytes Absolute: 0.5 10*3/uL (ref 0.1–0.9)
Monocytes: 6 %
Neutrophils Absolute: 5.5 10*3/uL (ref 1.4–7.0)
Neutrophils: 71 %
Platelets: 351 10*3/uL (ref 150–450)
RBC: 4.48 x10E6/uL (ref 3.77–5.28)
RDW: 12.5 % (ref 11.7–15.4)
WBC: 7.8 10*3/uL (ref 3.4–10.8)

## 2020-10-17 LAB — FOLATE: Folate: 6.3 ng/mL (ref >3.0)

## 2020-10-17 LAB — INSULIN, RANDOM: INSULIN: 8.8 u[IU]/mL (ref 2.6–24.9)

## 2020-10-17 LAB — LIPID PANEL WITH LDL/HDL RATIO
Cholesterol, Total: 176 mg/dL (ref 100–199)
HDL: 74 mg/dL (ref >39)
LDL Chol Calc (NIH): 86 mg/dL (ref 0–99)
LDL/HDL Ratio: 1.2 ratio (ref 0.0–3.2)
Triglycerides: 86 mg/dL (ref 0–149)
VLDL Cholesterol Cal: 16 mg/dL (ref 5–40)

## 2020-10-17 LAB — TSH: TSH: 0.392 u[IU]/mL — ABNORMAL LOW (ref 0.450–4.500)

## 2020-10-17 LAB — T3: T3, Total: 181 ng/dL — ABNORMAL HIGH (ref 71–180)

## 2020-10-17 LAB — T4: T4, Total: 15.3 ug/dL — ABNORMAL HIGH (ref 4.5–12.0)

## 2020-10-17 LAB — VITAMIN B12: Vitamin B-12: 427 pg/mL (ref 232–1245)

## 2020-10-17 LAB — VITAMIN D 25 HYDROXY (VIT D DEFICIENCY, FRACTURES): Vit D, 25-Hydroxy: 75.3 ng/mL (ref 30.0–100.0)

## 2020-10-21 ENCOUNTER — Other Ambulatory Visit (HOSPITAL_COMMUNITY): Payer: Self-pay | Admitting: Rheumatology

## 2020-10-22 MED FILL — HYDROXYCHLOROQUINE SULFATE: 200 | 90 days supply | Qty: 180 | Fill #0

## 2020-10-22 NOTE — Progress Notes (Signed)
Chief Complaint:   OBESITY Emily Miller (MR# [id number]) is a 35 y.o. female who presents for evaluation and treatment of obesity and related comorbidities. Current BMI is Body mass index is 30.87 kg/m. Emily Miller has been struggling with Emily Miller weight for many years and has been unsuccessful in either losing weight, maintaining weight loss, or reaching Emily Miller healthy weight goal.  Emily Miller heard about clinic from coworkers. She usually skips breakfast and have coffee with 2 tbsp half/half. Emily Miller may graze on fruit or salad. Emily Miller may have a chef salad with egg or sandwich meat.   Emily Miller is currently in the action stage of change and ready to dedicate time achieving and maintaining a healthier weight. Emily Miller is interested in becoming our patient and working on intensive lifestyle modifications including (but not limited to) diet and exercise for weight loss.  Emily Miller's habits were reviewed today and are as follows: Emily Miller family eats meals together, she thinks Emily Miller family will eat healthier with Emily Miller, Emily Miller desired weight loss is 54-44 lbs, she has been heavy most of Emily Miller life, she started gaining weight at 35 years old, Emily Miller heaviest weight ever was 250 pounds, she has significant food cravings issues, she skips meals frequently and she struggles with emotional eating.  Depression Screen [name]'s Food and Mood (modified PHQ-9) score was 3.  Depression screen [name] 2/9 10/16/2020  Decreased Interest 0  Down, Depressed, Hopeless 0  PHQ - 2 Score 0  Altered sleeping 1  Tired, decreased energy 1  Change in appetite 1  Feeling bad or failure about yourself  0  Trouble concentrating 0  Moving slowly or fidgety/restless 0  Suicidal thoughts 0  PHQ-9 Score 3  Difficult doing work/chores Not difficult at all   Subjective:   1. Other fatigue [name] admits to daytime somnolence and admits to waking up still tired. Patent has a history of symptoms of daytime fatigue and morning headache. [name]  generally gets 6 hours of sleep per night, and states that she has difficulty falling asleep. Snoring is present. Apneic episodes is not present. Epworth Sleepiness Score is 1.  2. SOB (shortness of breath) on exertion [name] notes increasing shortness of breath with exercising and seems to be worsening over time with weight gain. She notes getting out of breath sooner with activity than she used to. This has gotten worse recently. [name] denies shortness of breath at rest or orthopnea.  3. Insulin resistance [name] is on 2.4 mg Wegovy (successful with weight loss on medication). On GLP-1 since April/May 2021.  4. Vitamin D deficiency [name] is on Vitamin D supplement. [name] denies nausea, vomiting, and muscle weakness. She endorses fatigue.  5. At risk for diabetes mellitus [name] is at higher than average risk for developing diabetes due to obesity.    Assessment/Plan:   1. Other fatigue - EKG 12-Lead - Vitamin B12 - CBC with Differential/Platelet - Folate - T3 - T4 - TSH  2. SOB (shortness of breath) on exertion  3. Insulin resistance - Comprehensive metabolic panel - Hemoglobin A1c - Insulin, random - Lipid Panel With LDL/HDL Ratio  4. Vitamin D deficiency Check Vitamin D level today. - VITAMIN D 25 Hydroxy (Vit-D Deficiency, Fractures)  5. Depression screening Behavior modification techniques were discussed today to help [name] deal with Emily Miller emotional/non-hunger eating behaviors.  Orders and follow up as documented in patient record.   6. At risk for diabetes mellitus [name] was given approximately 15 minutes of diabetes education and counseling today.  We discussed intensive lifestyle modifications today with an emphasis on weight loss as well as increasing exercise and decreasing simple carbohydrates in Emily Miller diet. We also reviewed medication options with an emphasis on risk versus benefit of those discussed.   Repetitive spaced learning was employed today to  elicit superior memory formation and behavioral change.  7. Class 1 obesity with serious comorbidity and body mass index (BMI) of 30.0 to 30.9 in adult, unspecified obesity type  [name] is currently in the action stage of change and Emily Miller goal is to continue with weight loss efforts. I recommend [name] begin the structured treatment plan as follows:  She has agreed to the Category 1 Plan + 100 calories with gluten free options.  Exercise goals: No exercise at this time.  Behavioral modification strategies: increasing lean protein intake, no skipping meals, meal planning and cooking strategies and keeping healthy foods in the home.  She was informed of the importance of frequent follow-up visits to maximize Emily Miller success with intensive lifestyle modifications for Emily Miller multiple health conditions. She was informed we would discuss Emily Miller lab results at Emily Miller next visit unless there is a critical issue that needs to be addressed sooner. [name] agreed to keep Emily Miller next visit at the agreed upon time to discuss these results.  Objective:   Blood pressure 101/70, pulse 80, temperature 98.1 F (36.7 C), temperature source Oral, height  (1.727 m), weight 203 lb (92.1 kg), last menstrual period 09/24/2020, SpO2 100 %. Body mass index is 30.87 kg/m.  EKG: Normal sinus rhythm, rate 84 bpm.  Indirect Calorimeter completed today shows a VO2 of 202 and a REE of 1411.  Emily Miller calculated basal metabolic rate is 4540 thus Emily Miller basal metabolic rate is worse than expected.  General: Cooperative, alert, well developed, in no acute distress. HEENT: Conjunctivae and lids unremarkable. Cardiovascular: Regular rhythm.  Lungs: Normal work of breathing. Neurologic: No focal deficits.   Lab Results  Component Value Date   CREATININE 0.70 10/16/2020   BUN 8 10/16/2020   NA 139 10/16/2020   K 4.5 10/16/2020   CL 104 10/16/2020   CO2 24 10/16/2020   Lab Results  Component Value Date   ALT 40 (H) 10/16/2020   AST  23 10/16/2020   ALKPHOS 81 10/16/2020   BILITOT <0.2 10/16/2020   Lab Results  Component Value Date   HGBA1C 5.0 10/16/2020   Lab Results  Component Value Date   INSULIN 8.8 10/16/2020   Lab Results  Component Value Date   TSH 0.392 (L) 10/16/2020   Lab Results  Component Value Date   CHOL 176 10/16/2020   HDL 74 10/16/2020   LDLCALC 86 10/16/2020   TRIG 86 10/16/2020   Lab Results  Component Value Date   WBC 7.8 10/16/2020   HGB 13.2 10/16/2020   HCT 39.3 10/16/2020   MCV 88 10/16/2020   PLT 351 10/16/2020   No results found for: IRON, TIBC, FERRITIN  ARRANGE: Glorimar was educated on the importance of frequent visits to treat obesity as outlined per CMS and USPSTF guidelines and agreed to schedule Emily Miller next follow up appointment today.  Attestation Statements:   Reviewed by clinician on day of visit: allergies, medications, problem list, medical history, surgical history, family history, social history, and previous encounter notes.  I, Delorse Limber, am acting as Energy manager for Reuben Likes, MD.  This is the patient's first visit at [facility]. The patient's NEW PATIENT PACKET was reviewed at length. Included in  the packet: current and past health history, medications, allergies, ROS, gynecologic history (women only), surgical history, family history, social history, weight history, weight loss surgery history (for those that have had weight loss surgery), nutritional evaluation, mood and food questionnaire, PHQ9, Epworth questionnaire, sleep habits questionnaire, patient life and health improvement goals questionnaire. These will all be scanned into the patient's chart under media.   During the visit, I independently reviewed the patient's EKG, bioimpedance scale results, and indirect calorimeter results. I used this information to tailor a meal plan for the patient that will help Emily Miller to lose weight and will improve Emily Miller obesity-related  conditions going forward. I performed a medically necessary appropriate examination and/or evaluation. I discussed the assessment and treatment plan with the patient. The patient was provided an opportunity to ask questions and all were answered. The patient agreed with the plan and demonstrated an understanding of the instructions. Labs were ordered at this visit and will be reviewed at the next visit unless more critical results need to be addressed immediately. Clinical information was updated and documented in the EMR.   Time spent on visit including pre-visit chart review and post-visit care was 45 minutes.   A separate 15 minutes was spent on risk counseling (see above).

## 2020-10-30 ENCOUNTER — Ambulatory Visit (INDEPENDENT_AMBULATORY_CARE_PROVIDER_SITE_OTHER): Payer: No Typology Code available for payment source | Admitting: Family Medicine

## 2020-10-30 ENCOUNTER — Other Ambulatory Visit: Payer: Self-pay

## 2020-10-30 ENCOUNTER — Encounter (INDEPENDENT_AMBULATORY_CARE_PROVIDER_SITE_OTHER): Payer: Self-pay | Admitting: Family Medicine

## 2020-10-30 VITALS — BP 103/70 | HR 105 | Temp 97.9°F | Ht 68.0 in | Wt 196.0 lb

## 2020-10-30 DIAGNOSIS — E669 Obesity, unspecified: Secondary | ICD-10-CM | POA: Diagnosis not present

## 2020-10-30 DIAGNOSIS — R7401 Elevation of levels of liver transaminase levels: Secondary | ICD-10-CM

## 2020-10-30 DIAGNOSIS — E0590 Thyrotoxicosis, unspecified without thyrotoxic crisis or storm: Secondary | ICD-10-CM | POA: Diagnosis not present

## 2020-10-30 DIAGNOSIS — E8881 Metabolic syndrome: Secondary | ICD-10-CM

## 2020-10-30 DIAGNOSIS — Z6830 Body mass index (BMI) 30.0-30.9, adult: Secondary | ICD-10-CM

## 2020-11-04 ENCOUNTER — Other Ambulatory Visit (HOSPITAL_COMMUNITY): Payer: Self-pay | Admitting: Internal Medicine

## 2020-11-04 MED FILL — WEGOVY 2.4 MG/0.75ML SOAJ: 2.4 | 28 days supply | Qty: 3 | Fill #1

## 2020-11-04 MED FILL — FEMYNOR 0.25-35 MG-MCG TABS: 0.25-35 | 84 days supply | Qty: 84 | Fill #2

## 2020-11-04 NOTE — Progress Notes (Signed)
Chief Complaint:   OBESITY Emily Miller is here to discuss her progress with her obesity treatment plan along with follow-up of her obesity related diagnoses. [name] is on the Category 1 Plan and states she is following her eating plan approximately 100% of the time. [name] states she is walking at work.  Today's visit was #: 2 Starting weight: 203 lbs Starting date: 1/5/ 2022 Today's weight: 196 lbs Today's date: 10/30/2020 Total lbs lost to date: 7 lbs Total lbs lost since last in-office visit: 7 lbs  Interim History: [name] had to remind herself to eat throughout the day. She likes the food on the plan for the most part. [name] denies hunger. She did eat a piece of cake once for kids birthday. [name] is starting full time in [place] at the end of January.  Subjective:   1.Transaminitis [name]'s ALT was 40, AST 23, and Phos was 81. [name] has no prior history.  2. Insulin resistance [name] has a diagnosis of insulin resistance based on her elevated fasting insulin level >5. She continues to work on diet and exercise to decrease her risk of diabetes. [name]'s last A1c was >30 but now at 8.8. A1c 5.0. [name] is on GLP-1.  Lab Results  Component Value Date   INSULIN 8.8 10/16/2020   Lab Results  Component Value Date   HGBA1C 5.0 10/16/2020   3. Hyperthyroidism Amel sees an endocrinologist at [facility].     Assessment/Plan:   1. Transaminitis Will repeat labs in 3 months.  2. Insulin resistance [name] will continue to work on weight loss, exercise, and decreasing simple carbohydrates to help decrease the risk of diabetes. [name] agreed to follow-up with [place] as directed to closely monitor her progress. [name] will continue Wegovy 2.4 mg.  3. Hyperthyroidism [name] will follow up with Endocrinology.  4. Class 1 obesity with serious comorbidity and body mass index (BMI) of 30.0 to 30.9 in adult, unspecified obesity type Comments: BMI greater than  30 at the start of the program. [name] is currently in the action stage of change. As such, her goal is to continue with weight loss efforts. She has agreed to the Category 1 Plan.   Exercise goals: No exercise has been prescribed at this time.  Behavioral modification strategies: increasing lean protein intake, meal planning and cooking strategies, keeping healthy foods in the home and planning for success.  [name] has agreed to follow-up with our clinic in 2 weeks. She was informed of the importance of frequent follow-up visits to maximize her success with intensive lifestyle modifications for her multiple health conditions.     Objective:   Blood pressure 103/70, pulse (!) 105, temperature 97.9 F (36.6 C), temperature source Oral, height 5\' 8"  (1.727 m), weight 196 lb (88.9 kg), last menstrual period 10/06/2020, SpO2 100 %. Body mass index is 29.8 kg/m.  General: Cooperative, alert, well developed, in no acute distress. HEENT: Conjunctivae and lids unremarkable. Cardiovascular: Regular rhythm.  Lungs: Normal work of breathing. Neurologic: No focal deficits.   Lab Results  Component Value Date   CREATININE 0.70 10/16/2020   BUN 8 10/16/2020   NA 139 10/16/2020   K 4.5 10/16/2020   CL 104 10/16/2020   CO2 24 10/16/2020   Lab Results  Component Value Date   ALT 40 (H) 10/16/2020   AST 23 10/16/2020   ALKPHOS 81 10/16/2020   BILITOT <0.2 10/16/2020   Lab Results  Component Value Date   HGBA1C 5.0 10/16/2020   Lab  Results  Component Value Date   INSULIN 8.8 10/16/2020   Lab Results  Component Value Date   TSH 0.392 (L) 10/16/2020   Lab Results  Component Value Date   CHOL 176 10/16/2020   HDL 74 10/16/2020   LDLCALC 86 10/16/2020   TRIG 86 10/16/2020   Lab Results  Component Value Date   WBC 7.8 10/16/2020   HGB 13.2 10/16/2020   HCT 39.3 10/16/2020   MCV 88 10/16/2020   PLT 351 10/16/2020   No results found for: IRON, TIBC,  FERRITIN    Attestation Statements:   Reviewed by clinician on day of visit: allergies, medications, problem list, medical history, surgical history, family history, social history, and previous encounter notes.    I, Delorse Limber, am acting as transcriptionist for Reuben Likes, MD.  I have reviewed the above documentation for accuracy and completeness, and I agree with the above. - Katherina Mires, MD

## 2020-11-14 ENCOUNTER — Other Ambulatory Visit: Payer: Self-pay

## 2020-11-14 ENCOUNTER — Telehealth (INDEPENDENT_AMBULATORY_CARE_PROVIDER_SITE_OTHER): Payer: No Typology Code available for payment source | Admitting: Family Medicine

## 2020-11-14 ENCOUNTER — Encounter (INDEPENDENT_AMBULATORY_CARE_PROVIDER_SITE_OTHER): Payer: Self-pay

## 2020-11-14 ENCOUNTER — Encounter (INDEPENDENT_AMBULATORY_CARE_PROVIDER_SITE_OTHER): Payer: Self-pay | Admitting: Family Medicine

## 2020-11-14 DIAGNOSIS — E0590 Thyrotoxicosis, unspecified without thyrotoxic crisis or storm: Secondary | ICD-10-CM | POA: Diagnosis not present

## 2020-11-14 DIAGNOSIS — E669 Obesity, unspecified: Secondary | ICD-10-CM

## 2020-11-14 DIAGNOSIS — Z6830 Body mass index (BMI) 30.0-30.9, adult: Secondary | ICD-10-CM | POA: Diagnosis not present

## 2020-11-14 DIAGNOSIS — E8881 Metabolic syndrome: Secondary | ICD-10-CM

## 2020-11-14 NOTE — Telephone Encounter (Signed)
Please advise 

## 2020-11-19 NOTE — Progress Notes (Signed)
TeleHealth Visit:  Due to the COVID-19 pandemic, this visit was completed with telemedicine (audio/video) technology to reduce patient and provider exposure as well as to preserve personal protective equipment.   Emily Miller has verbally consented to this TeleHealth visit. The patient is located at home, the provider is located at the [facility] office. The participants in this visit include the listed provider and patient. The visit was conducted today via video.   Chief Complaint: OBESITY Emily Miller is here to discuss her progress with her obesity treatment plan along with follow-up of her obesity related diagnoses. Emily Miller is on the Category 1 Plan and states she is following her eating plan approximately 100% of the time. Emily Miller states she is walking at home.  Today's visit was #: 3 Starting weight: 203 lbs Starting date: 10/16/2020  Interim History: Emily Miller reports a weight of 196 lbs at home. She has just started traveling to [facility] for work weekly. Pt is experiencing hunger but can make it tot he next meal. She is using gluten free options. First week on the job pt brought all her food to [place] with her and stayed completely on plan.   Subjective:   1. Hyperthyroidism Emily Miller is not on meds.She seed Endocrinology at [facility].  2. Insulin resistance Emily Miller is on GLP-1. She is starting to experience some hunger and minimal carb cravings.  Assessment/Plan:   1. Hyperthyroidism Follow up with Endocrinology at previously scheduled appointment.  2. Insulin resistance Follow up with hunger at next appointment. Stepwise up in food given.  3. Class 1 obesity with serious comorbidity and body mass index (BMI) of 30.0 to 30.9 in adult, unspecified obesity type Emily Miller is currently in the action stage of change. As such, her goal is to continue with weight loss efforts. She has agreed to the Category 2 Plan.   Pt given stepwise increase to Category 2- 1st  increase to 200 snack calories total; 2nd add an additional 1 cup of vegetables; 3rd increase up to 8 oz of protein total for dinner.  Exercise goals: As is  Behavioral modification strategies: increasing lean protein intake, meal planning and cooking strategies, keeping healthy foods in the home and planning for success.  Emily Miller has agreed to follow-up with our clinic in 3 weeks. She was informed of the importance of frequent follow-up visits to maximize her success with intensive lifestyle modifications for her multiple health conditions.  Objective:   VITALS: Per patient if applicable, see vitals. GENERAL: Alert and in no acute distress. CARDIOPULMONARY: No increased WOB. Speaking in clear sentences.  PSYCH: Pleasant and cooperative. Speech normal rate and rhythm. Affect is appropriate. Insight and judgement are appropriate. Attention is focused, linear, and appropriate.  NEURO: Oriented as arrived to appointment on time with no prompting.   Lab Results  Component Value Date   CREATININE 0.70 10/16/2020   BUN 8 10/16/2020   NA 139 10/16/2020   K 4.5 10/16/2020   CL 104 10/16/2020   CO2 24 10/16/2020   Lab Results  Component Value Date   ALT 40 (H) 10/16/2020   AST 23 10/16/2020   ALKPHOS 81 10/16/2020   BILITOT <0.2 10/16/2020   Lab Results  Component Value Date   HGBA1C 5.0 10/16/2020   Lab Results  Component Value Date   INSULIN 8.8 10/16/2020   Lab Results  Component Value Date   TSH 0.392 (L) 10/16/2020   Lab Results  Component Value Date   CHOL 176 10/16/2020  HDL 74 10/16/2020   LDLCALC 86 10/16/2020   TRIG 86 10/16/2020   Lab Results  Component Value Date   WBC 7.8 10/16/2020   HGB 13.2 10/16/2020   HCT 39.3 10/16/2020   MCV 88 10/16/2020   PLT 351 10/16/2020   No results found for: IRON, TIBC, FERRITIN  Attestation Statements:   Reviewed by clinician on day of visit: allergies, medications, problem list, medical history, surgical history,  family history, social history, and previous encounter notes.  Edmund Hilda, am acting as transcriptionist for Reuben Likes, MD.   I have reviewed the above documentation for accuracy and completeness, and I agree with the above. - Katherina Mires, MD

## 2020-11-20 MED FILL — TRI FEMYNOR 28 TABLET: 0.18/0.215/ | 84 days supply | Qty: 84 | Fill #2

## 2020-11-26 MED FILL — WEGOVY 2.4 MG/0.75ML SOAJ: 2.4 | 28 days supply | Qty: 3 | Fill #0

## 2020-12-05 ENCOUNTER — Encounter (INDEPENDENT_AMBULATORY_CARE_PROVIDER_SITE_OTHER): Payer: Self-pay | Admitting: Family Medicine

## 2020-12-05 ENCOUNTER — Other Ambulatory Visit: Payer: Self-pay

## 2020-12-05 ENCOUNTER — Ambulatory Visit (INDEPENDENT_AMBULATORY_CARE_PROVIDER_SITE_OTHER): Payer: No Typology Code available for payment source | Admitting: Family Medicine

## 2020-12-05 VITALS — BP 91/61 | HR 87 | Temp 97.6°F | Ht 68.0 in | Wt 192.0 lb

## 2020-12-05 DIAGNOSIS — E8881 Metabolic syndrome: Secondary | ICD-10-CM | POA: Diagnosis not present

## 2020-12-05 DIAGNOSIS — E669 Obesity, unspecified: Secondary | ICD-10-CM | POA: Diagnosis not present

## 2020-12-05 DIAGNOSIS — Z6830 Body mass index (BMI) 30.0-30.9, adult: Secondary | ICD-10-CM | POA: Diagnosis not present

## 2020-12-05 DIAGNOSIS — E559 Vitamin D deficiency, unspecified: Secondary | ICD-10-CM | POA: Diagnosis not present

## 2020-12-09 NOTE — Progress Notes (Signed)
Chief Complaint:   OBESITY Emily Miller is here to discuss her progress with her obesity treatment plan along with follow-up of her obesity related diagnoses. Emily Miller is on the Category 1 Plan and states she is following her eating plan approximately 95% of the time. Emily Miller states she is walking 13 minutes 3-4 times per week.  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 She hasn't been that hungry and so has been sticking mostly to category 1. She hasn't been doing gluten free options. Pt is doing weill with planning and prepping while away in [place]. She has no upcoming obstacle.  Subjective:   1. Vitamin D deficiency Pt denies nausea, vomiting, and muscle weakness but notes fatigue. Pt is on OTC Vit D.  2. Insulin resistance Emily Miller A1c is 5.0 with an insulin level of 8.8. she is on GLP-1 [facility]) with no GI side effects.  Lab Results  Component Value Date   INSULIN 8.8 10/16/2020   Lab Results  Component Value Date   HGBA1C 5.0 10/16/2020    Assessment/Plan:   1. Vitamin D deficiency Low Vitamin D level contributes to fatigue and are associated with obesity, breast, and colon cancer. She agrees to continue to take OTC Vitamin D and will follow-up for routine testing of Vitamin D, at least 2-3 times per year to avoid over-replacement.  2. Insulin resistance Emily Miller will continue to work on weight loss, exercise, and decreasing simple carbohydrates to help decrease the risk of diabetes. Emily Miller agreed to follow-up with [place] as directed to closely monitor her progress. Repeat labs in 2 months.  3. Class 1 obesity with serious comorbidity and body mass index (BMI) of 30.0 to 30.9 in adult, unspecified obesity type Emily Miller is currently in the action  stage of change. As such, her goal is to continue with weight loss efforts. She has agreed to the Category 1 Plan.   Continue Wegovy at 2.4 mg dose.  Exercise goals: Pt is to consider what activity she'd like to start implementing.  Behavioral modification strategies: increasing lean protein intake, meal planning and cooking strategies, keeping healthy foods in the home and planning for success.  Emily Miller has agreed to follow-up with our clinic in 2-3 weeks. She was informed of the importance of frequent follow-up visits to maximize her success with intensive lifestyle modifications for her multiple health conditions.   Objective:   Blood pressure 91/61, pulse 87, temperature 97.6 F (36.4 C), temperature source Oral, height 5\' 8"  (1.727 m), weight 192 lb (87.1 kg), last menstrual period 11/19/2020, SpO2 99 %. Body mass index is 29.19 kg/m.  General: Cooperative, alert, well developed, in no acute distress. HEENT: Conjunctivae and lids unremarkable. Cardiovascular: Regular rhythm.  Lungs: Normal work of breathing. Neurologic: No focal deficits.   Lab Results  Component Value Date   CREATININE 0.70 10/16/2020   BUN 8 10/16/2020   NA 139 10/16/2020   K 4.5 10/16/2020   CL 104 10/16/2020   CO2 24 10/16/2020   Lab Results  Component Value Date   ALT 40 (H) 10/16/2020   AST 23 10/16/2020   ALKPHOS 81 10/16/2020   BILITOT <0.2 10/16/2020   Lab Results  Component Value Date   HGBA1C 5.0 10/16/2020   Lab Results  Component  Value Date   INSULIN 8.8 10/16/2020   Lab Results  Component Value Date   TSH 0.392 (L) 10/16/2020   Lab Results  Component Value Date   CHOL 176 10/16/2020   HDL 74 10/16/2020   LDLCALC 86 10/16/2020   TRIG 86 10/16/2020   Lab Results  Component Value Date   WBC 7.8 10/16/2020   HGB 13.2 10/16/2020   HCT 39.3 10/16/2020   MCV 88 10/16/2020   PLT 351 10/16/2020     Attestation Statements:   Reviewed by clinician on day of visit:  allergies, medications, problem list, medical history, surgical history, family history, social history, and previous encounter notes.  Edmund Hilda, am acting as transcriptionist for Reuben Likes, MD.  I have reviewed the above documentation for accuracy and completeness, and I agree with the above. - Katherina Mires, MD

## 2020-12-19 ENCOUNTER — Ambulatory Visit (INDEPENDENT_AMBULATORY_CARE_PROVIDER_SITE_OTHER): Payer: No Typology Code available for payment source | Admitting: Family Medicine

## 2020-12-23 ENCOUNTER — Encounter (INDEPENDENT_AMBULATORY_CARE_PROVIDER_SITE_OTHER): Payer: Self-pay | Admitting: Family Medicine

## 2020-12-23 ENCOUNTER — Other Ambulatory Visit: Payer: Self-pay

## 2020-12-23 ENCOUNTER — Ambulatory Visit (INDEPENDENT_AMBULATORY_CARE_PROVIDER_SITE_OTHER): Payer: No Typology Code available for payment source | Admitting: Family Medicine

## 2020-12-23 VITALS — BP 105/70 | HR 91 | Temp 97.5°F | Ht 68.0 in | Wt 188.0 lb

## 2020-12-23 DIAGNOSIS — R7401 Elevation of levels of liver transaminase levels: Secondary | ICD-10-CM | POA: Diagnosis not present

## 2020-12-23 DIAGNOSIS — E669 Obesity, unspecified: Secondary | ICD-10-CM | POA: Diagnosis not present

## 2020-12-23 DIAGNOSIS — Z6830 Body mass index (BMI) 30.0-30.9, adult: Secondary | ICD-10-CM

## 2020-12-23 DIAGNOSIS — E8881 Metabolic syndrome: Secondary | ICD-10-CM | POA: Diagnosis not present

## 2020-12-25 NOTE — Progress Notes (Signed)
Chief Complaint:   OBESITY Emily Miller is here to discuss her progress with her obesity treatment plan along with follow-up of her obesity related diagnoses. Emily Miller is on the Category 1 Plan and states she is following her eating plan approximately 95% of the time. Emily Miller states she is doing 0 minutes 0 times per week.  Today's visit was #: 5 Starting weight: 203 lbs Starting date: 10/16/2020 Today's weight: 188 lbs Today's date: 12/23/2020 Total lbs lost to date: 15 lbs Total lbs lost since last in-office visit: 4 lbs  Interim History: Jim has been doing really well on category 1. She is occasionally adding extra protein at dinner (up to 7 oz). She is dong [place] yogurt in the morning with toast and cheese, then sandwich for lunch with cheese stick and fruit. She is doing protein and vegetables at dinner. She has no upcoming plans for the next few weeks.  Subjective:   1. Insulin resistance Docia is on Wegovy. She denies GI side effects of GLP-1.  Lab Results  Component Value Date   INSULIN 8.8 10/16/2020   Lab Results  Component Value Date   HGBA1C 5.0 10/16/2020    2. Transaminitis Towanna's last ALT 40 and AST 23. She likely has NAFLD.  Assessment/Plan:   1. Insulin resistance Allice will continue to work on weight loss, exercise, and decreasing simple carbohydrates to help decrease the risk of diabetes. Tallie agreed to follow-up with [place] as directed to closely monitor her progress. Continue [id number].  2. Transaminitis Repeat labs in May.  3. Class 1 obesity with serious comorbidity and body mass index (BMI) of 30.0 to 30.9 in adult, unspecified obesity type Serinity is currently in the action stage of change. As such, her goal is to continue with weight loss efforts. She has agreed to the Category 1 Plan.   Exercise goals: Pt is to start kettlebell workouts ~15 minutes 2-3 days a week.  Behavioral modification strategies: increasing lean protein intake, meal  planning and cooking strategies, keeping healthy foods in the home and planning for success.  Shanik has agreed to follow-up with our clinic in 4 weeks. She was informed of the importance of frequent follow-up visits to maximize her success with intensive lifestyle modifications for her multiple health conditions.   Objective:   Blood pressure 105/70, pulse 91, temperature (!) 97.5 F (36.4 C), temperature source Oral, height 5\' 8"  (1.727 m), weight 188 lb (85.3 kg), last menstrual period 12/16/2020, SpO2 100 %. Body mass index is 28.59 kg/m.  General: Cooperative, alert, well developed, in no acute distress. HEENT: Conjunctivae and lids unremarkable. Cardiovascular: Regular rhythm.  Lungs: Normal work of breathing. Neurologic: No focal deficits.   Lab Results  Component Value Date   CREATININE 0.70 10/16/2020   BUN 8 10/16/2020   NA 139 10/16/2020   K 4.5 10/16/2020   CL 104 10/16/2020   CO2 24 10/16/2020   Lab Results  Component Value Date   ALT 40 (H) 10/16/2020   AST 23 10/16/2020   ALKPHOS 81 10/16/2020   BILITOT <0.2 10/16/2020   Lab Results  Component Value Date   HGBA1C 5.0 10/16/2020   Lab Results  Component Value Date   INSULIN 8.8 10/16/2020   Lab Results  Component Value Date   TSH 0.392 (L) 10/16/2020   Lab Results  Component Value Date   CHOL 176 10/16/2020   HDL 74 10/16/2020   LDLCALC 86 10/16/2020   TRIG 86 10/16/2020   Lab Results  Component Value Date   WBC 7.8 10/16/2020   HGB 13.2 10/16/2020   HCT 39.3 10/16/2020   MCV 88 10/16/2020   PLT 351 10/16/2020  .  Attestation Statements:   Reviewed by clinician on day of visit: allergies, medications, problem list, medical history, surgical history, family history, social history, and previous encounter notes.  Time spent on visit including pre-visit chart review and post-visit care and charting was 15 minutes.   Edmund Hilda, am acting as transcriptionist for Reuben Likes,  MD.   I have reviewed the above documentation for accuracy and completeness, and I agree with the above. - Katherina Mires, MD

## 2020-12-31 ENCOUNTER — Encounter (INDEPENDENT_AMBULATORY_CARE_PROVIDER_SITE_OTHER): Payer: Self-pay

## 2021-01-08 ENCOUNTER — Other Ambulatory Visit: Payer: Self-pay | Admitting: Emergency Medicine

## 2021-01-08 ENCOUNTER — Telehealth: Payer: No Typology Code available for payment source | Admitting: Emergency Medicine

## 2021-01-08 DIAGNOSIS — J019 Acute sinusitis, unspecified: Secondary | ICD-10-CM

## 2021-01-08 DIAGNOSIS — B9689 Other specified bacterial agents as the cause of diseases classified elsewhere: Secondary | ICD-10-CM | POA: Diagnosis not present

## 2021-01-08 MED ORDER — FLUCONAZOLE 150 MG PO TABS: 150.0000 mg | ORAL_TABLET | Freq: Once | ORAL | 0 refills | Status: DC

## 2021-01-08 MED ORDER — AMOXICILLIN-POT CLAVULANATE 875-125 MG PO TABS: 1.0000 | ORAL_TABLET | Freq: Two times a day (BID) | ORAL | 0 refills | Status: DC

## 2021-01-08 MED FILL — FLUCONAZOLE 150 MG TABS: 150 | 1 days supply | Qty: 1 | Fill #0

## 2021-01-08 MED FILL — AMOX-CLAV 875-125 MG TABLET: 875-125 | 7 days supply | Qty: 14 | Fill #0

## 2021-01-08 NOTE — Progress Notes (Signed)

## 2021-01-08 NOTE — Addendum Note (Signed)
Addended by: Lurene Shadow on: 01/08/2021 09:02 AM   Modules accepted: Orders

## 2021-01-17 ENCOUNTER — Telehealth: Payer: No Typology Code available for payment source | Admitting: Nurse Practitioner

## 2021-01-17 DIAGNOSIS — B373 Candidiasis of vulva and vagina: Secondary | ICD-10-CM | POA: Diagnosis not present

## 2021-01-17 DIAGNOSIS — B3731 Acute candidiasis of vulva and vagina: Secondary | ICD-10-CM

## 2021-01-17 MED ORDER — FLUCONAZOLE 150 MG PO TABS: ORAL_TABLET | ORAL | 0 refills | Status: DC

## 2021-01-17 NOTE — Progress Notes (Signed)
We are sorry that you are not feeling well. Here is how we plan to help! Based on what you shared with me it looks like you: May have a yeast vaginosis  Vaginosis is an inflammation of the vagina that can result in discharge, itching and pain. The cause is usually a change in the normal balance of vaginal bacteria or an infection. Vaginosis can also result from reduced estrogen levels after menopause.  The most common causes of vaginosis are:   Bacterial vaginosis which results from an overgrowth of one on several organisms that are normally present in your vagina.   Yeast infections which are caused by a naturally occurring fungus called candida.   Vaginal atrophy (atrophic vaginosis) which results from the thinning of the vagina from reduced estrogen levels after menopause.   Trichomoniasis which is caused by a parasite and is commonly transmitted by sexual intercourse.  Factors that increase your risk of developing vaginosis include: Marland Kitchen Medications, such as antibiotics and steroids . Uncontrolled diabetes . Use of hygiene products such as bubble bath, vaginal spray or vaginal deodorant . Douching . Wearing damp or tight-fitting clothing . Using an intrauterine device (IUD) for birth control . Hormonal changes, such as those associated with pregnancy, birth control pills or menopause . Sexual activity . Having a sexually transmitted infection  Your treatment plan is A single Diflucan (fluconazole) 150mg  tablet once and repeat in 1 week.  I have electronically sent this prescription into the pharmacy that you have chosen.  Be sure to take all of the medication as directed. Stop taking any medication if you develop a rash, tongue swelling or shortness of breath. Mothers who are breast feeding should consider pumping and discarding their breast milk while on these antibiotics. However, there is no consensus that infant exposure at these doses would be harmful.  Remember that medication  creams can weaken latex condoms.   HOME CARE:  Good hygiene may prevent some types of vaginosis from recurring and may relieve some symptoms:  . Avoid baths, hot tubs and whirlpool spas. Rinse soap from your outer genital area after a shower, and dry the area well to prevent irritation. Don't use scented or harsh soaps, such as those with deodorant or antibacterial action. Marland Kitchen Avoid irritants. These include scented tampons and pads. . Wipe from front to back after using the toilet. Doing so avoids spreading fecal bacteria to your vagina.  Other things that may help prevent vaginosis include:  Marland Kitchen Don't douche. Your vagina doesn't require cleansing other than normal bathing. Repetitive douching disrupts the normal organisms that reside in the vagina and can actually increase your risk of vaginal infection. Douching won't clear up a vaginal infection. . Use a latex condom. Both female and female latex condoms may help you avoid infections spread by sexual contact. . Wear cotton underwear. Also wear pantyhose with a cotton crotch. If you feel comfortable without it, skip wearing underwear to bed. Yeast thrives in Mar[place] Kitchen Your symptoms should improve in the next day or two.  GET HELP RIGHT AWAY IF:  . You have pain in your lower abdomen ( pelvic area or over your ovaries) . You develop nausea or vomiting . You develop a fever . Your discharge changes or worsens . You have persistent pain with intercourse . You develop shortness of breath, a rapid pulse, or you faint.  These symptoms could be signs of problems or infections that need to be evaluated by a medical provider now.  MAKE SURE YOU    Understand these instructions.  Will watch your condition.  Will get help right away if you are not doing well or get worse.  Your e-visit answers were reviewed by a board certified advanced clinical practitioner to complete your personal care plan. Depending upon the condition, your  plan could have included both over the counter or prescription medications. Please review your pharmacy choice to make sure that you have choses a pharmacy that is open for you to pick up any needed prescription, Your safety is important to [place]. If you have drug allergies check your prescription carefully.   You can use MyChart to ask questions about today's visit, request a non-urgent call back, or ask for a work or school excuse for 24 hours related to this e-Visit. If it has been greater than 24 hours you will need to follow up with your provider, or enter a new e-Visit to address those concerns. You will get a MyChart message within the next two days asking about your experience. I hope that your e-visit has been valuable and will speed your recovery.  5-10 minutes spent reviewing and documenting in chart.

## 2021-01-22 ENCOUNTER — Other Ambulatory Visit (HOSPITAL_COMMUNITY): Payer: Self-pay

## 2021-01-22 MED FILL — Semaglutide (Weight Mngmt) Soln Auto-Injector 2.4 MG/0.75ML: SUBCUTANEOUS | 28 days supply | Qty: 3 | Fill #0 | Status: AC

## 2021-01-23 ENCOUNTER — Ambulatory Visit (INDEPENDENT_AMBULATORY_CARE_PROVIDER_SITE_OTHER): Payer: No Typology Code available for payment source | Admitting: Family Medicine

## 2021-02-02 ENCOUNTER — Other Ambulatory Visit: Payer: Self-pay

## 2021-02-03 ENCOUNTER — Other Ambulatory Visit (HOSPITAL_COMMUNITY): Payer: Self-pay

## 2021-02-03 MED ORDER — HYDROXYCHLOROQUINE SULFATE 200 MG PO TABS
400.0000 mg | ORAL_TABLET | Freq: Every day | ORAL | 1 refills | Status: DC
  Filled 2021-02-03: qty 180, 90d supply, fill #0
  Filled 2021-05-06: qty 180, 90d supply, fill #1

## 2021-02-04 ENCOUNTER — Telehealth: Payer: Self-pay | Admitting: *Deleted

## 2021-02-04 ENCOUNTER — Other Ambulatory Visit (HOSPITAL_COMMUNITY): Payer: Self-pay

## 2021-02-04 DIAGNOSIS — B3731 Acute candidiasis of vulva and vagina: Secondary | ICD-10-CM

## 2021-02-04 DIAGNOSIS — B373 Candidiasis of vulva and vagina: Secondary | ICD-10-CM

## 2021-02-04 MED ORDER — FLUCONAZOLE 150 MG PO TABS
150.0000 mg | ORAL_TABLET | Freq: Once | ORAL | 0 refills | Status: AC
  Filled 2021-02-04: qty 2, 4d supply, fill #0

## 2021-02-04 MED FILL — Norgestimate-Eth Estrad Tab 0.18-35/0.215-35/0.25-35 MG-MCG: ORAL | 28 days supply | Qty: 28 | Fill #0 | Status: AC

## 2021-02-04 NOTE — Telephone Encounter (Signed)
Patient called reporting taking antibiotics and now having symptoms. Spoke with Edd Arbour, CNM to send in Diflucan 150 mg x 1 PO. Clovis Pu, RN

## 2021-02-05 ENCOUNTER — Other Ambulatory Visit (HOSPITAL_COMMUNITY): Payer: Self-pay

## 2021-02-05 MED ORDER — WEGOVY 2.4 MG/0.75ML ~~LOC~~ SOAJ
2.4000 mg | SUBCUTANEOUS | 3 refills | Status: DC
  Filled 2021-02-05 – 2021-05-06 (×3): qty 3, 28d supply, fill #0
  Filled 2021-06-11: qty 3, 28d supply, fill #1
  Filled 2021-07-10: qty 3, 28d supply, fill #2
  Filled 2021-08-15: qty 3, 28d supply, fill #3

## 2021-02-20 ENCOUNTER — Encounter: Payer: Self-pay | Admitting: Certified Nurse Midwife

## 2021-02-20 ENCOUNTER — Other Ambulatory Visit (HOSPITAL_COMMUNITY)
Admission: RE | Admit: 2021-02-20 | Discharge: 2021-02-20 | Disposition: A | Discharge status: Home / Self Care | Payer: No Typology Code available for payment source | Source: Ambulatory Visit | Attending: Certified Nurse Midwife | Admitting: Certified Nurse Midwife

## 2021-02-20 ENCOUNTER — Ambulatory Visit (INDEPENDENT_AMBULATORY_CARE_PROVIDER_SITE_OTHER): Payer: No Typology Code available for payment source | Admitting: Certified Nurse Midwife

## 2021-02-20 ENCOUNTER — Other Ambulatory Visit: Payer: Self-pay

## 2021-02-20 ENCOUNTER — Other Ambulatory Visit (HOSPITAL_COMMUNITY): Payer: Self-pay

## 2021-02-20 VITALS — BP 107/66 | HR 89 | Wt 190.4 lb

## 2021-02-20 DIAGNOSIS — Z124 Encounter for screening for malignant neoplasm of cervix: Secondary | ICD-10-CM

## 2021-02-20 DIAGNOSIS — N898 Other specified noninflammatory disorders of vagina: Secondary | ICD-10-CM | POA: Insufficient documentation

## 2021-02-20 MED FILL — Semaglutide (Weight Mngmt) Soln Auto-Injector 2.4 MG/0.75ML: SUBCUTANEOUS | 28 days supply | Qty: 3 | Fill #1 | Status: AC

## 2021-02-20 NOTE — Progress Notes (Signed)
GYNECOLOGY CLINIC ANNUAL PREVENTATIVE CARE ENCOUNTER NOTE  Subjective:   Emily Miller is a 35 y.o. G35P3010 female here for a routine annual gynecologic exam.  Current complaints: vaginitis frequently after intercourse. In a stable relationship, on birth control and does not use condoms so feels irritation is due to semen. Usually experiences increased vaginal discharge and discomfort after sex. Denies abnormal vaginal bleeding or other gynecologic concerns.    Gynecologic History Patient's last menstrual period was 02/12/2021 (exact date). Contraception: OCP (estrogen/progesterone) Last Pap: 2019. Results were: normal Last mammogram: N/A.   Obstetric History OB History  Gravida Para Term Preterm AB Living  4 3 3  0 1 0  SAB IAB Ectopic Multiple Live Births  1 0 0 0 0    # Outcome Date GA Lbr Len/2nd Weight Sex Delivery Anes PTL Lv  4 SAB           3 Term           2 Term           1 Term             Past Medical History:  Diagnosis Date  . Anemia   . Asthma    As a child. Not treated daily as an adult  . Back pain   . Constipation   . Hemorrhoid   . Insulin resistance   . Joint pain   . Multiple food allergies   . Osteoarthritis   . Spondylolisthesis   . Vitamin D deficiency     Past Surgical History:  Procedure Laterality Date  . TONSILECTOMY/ADENOIDECTOMY WITH MYRINGOTOMY      Current Outpatient Medications on File Prior to Visit  Medication Sig Dispense Refill  . amoxicillin-clavulanate (AUGMENTIN) 875-125 MG tablet TAKE 1 TABLET BY MOUTH 2 TIMES DAILY FOR 7 DAYS 14 tablet 0  . ASHWAGANDHA PO Take 300 mg by mouth.    . EVENING PRIMROSE OIL PO Take 1,300 mg by mouth.    . hydroxychloroquine (PLAQUENIL) 200 MG tablet Take by mouth daily.    . hydroxychloroquine (PLAQUENIL) 200 MG tablet TAKE 2 TABLETS BY MOUTH DAILY 180 tablet 0  . hydroxychloroquine (PLAQUENIL) 200 MG tablet TAKE 2 TABLETS BY MOUTH DAILY 180 tablet 0  . hydroxychloroquine (PLAQUENIL) 200  MG tablet Take 2 tablets (400 mg total) by mouth daily. 180 tablet 1  . MELATONIN ER PO Take 6 mg by mouth.    . nabumetone (RELAFEN) 500 MG tablet Take 500 mg by mouth daily.    . norgestimate-ethinyl estradiol (ORTHO-CYCLEN) 0.25-35 MG-MCG tablet Take 1 tablet by mouth daily. 30 tablet 11  . Norgestimate-Ethinyl Estradiol Triphasic 0.18/0.215/0.25 MG-35 MCG tablet TAKE 1 TABLET BY MOUTH ONCE DAILY. 28 tablet 12  . Nutritional Supplements (NUTRITIONAL SUPPLEMENT PO) Take 900 mg by mouth. Red Raspberry Leaf    . Semaglutide-Weight Management (WEGOVY) 2.4 MG/0.75ML SOAJ Inject 2.4 mg into the skin.    . Semaglutide-Weight Management (WEGOVY) 2.4 MG/0.75ML SOAJ Inject 0.27ml (2.4mg ) into the skin once week 3 mL 3  . Semaglutide-Weight Management 1.7 MG/0.75ML SOAJ INJECT 1.7MG  UNDER THE SKIN ONCE WEEKLY 3 mL 3  . Semaglutide-Weight Management 2.4 MG/0.75ML SOAJ INJECT 0.75 MLS INTO THE SKIN ONCE A WEEK. 3 mL 3  . Semaglutide-Weight Management 2.4 MG/0.75ML SOAJ INJECT 2.4MG  UNDER THE SKIN ONCE WEEKLY 3 mL 1  . VITAMIN D PO Take by mouth. With K2 125 mcg/181mcg     No current facility-administered medications on file prior to visit.  Allergies  Allergen Reactions  . Gluten Meal    Social History   Socioeconomic History  . Marital status: Married    Spouse name: Not on file  . Number of children: Not on file  . Years of education: Not on file  . Highest education level: Master's degree (e.g., MA, MS, MEng, MEd, MSW, MBA)  Occupational History  . Not on file  Tobacco Use  . Smoking status: Never Smoker  . Smokeless tobacco: Never Used  Vaping Use  . Vaping Use: Never used  Substance and Sexual Activity  . Alcohol use: Yes    Comment: soically  . Drug use: Never  . Sexual activity: Yes    Birth control/protection: Pill  Other Topics Concern  . Not on file  Social History Narrative  . Not on file   Social Determinants of Health   Financial Resource Strain: Not on file  Food  Insecurity: Not on file  Transportation Needs: Not on file  Physical Activity: Not on file  Stress: Not on file  Social Connections: Not on file  Intimate Partner Violence: Not on file   Family History  Problem Relation Age of Onset  . Anxiety disorder Mother   . Obesity Father    The following portions of the patient's history were reviewed and updated as appropriate: allergies, current medications, past family history, past medical history, past social history, past surgical history and problem list.  Review of Systems Pertinent items noted in HPI and remainder of comprehensive ROS otherwise negative.   Objective:  BP 107/66   Pulse 89   Wt 190 lb 6.4 oz (86.4 kg)   LMP 02/12/2021 (Exact Date)   BMI 28.95 kg/m  CONSTITUTIONAL: Well-developed, well-nourished female in no acute distress.  HENT:  Normocephalic, atraumatic, External right and left ear normal. Oropharynx is clear and moist EYES: Conjunctivae and EOM are normal. Pupils are equal, round, and reactive to light. No scleral icterus.  NECK: Normal range of motion, supple, no masses.  Normal thyroid.  SKIN: Skin is warm and dry. No rash noted. Not diaphoretic. No erythema. No pallor. NEUROLGIC: Alert and oriented to person, place, and time. Normal reflexes, muscle tone coordination. No cranial nerve deficit noted. PSYCHIATRIC: Normal mood and affect. Normal behavior. Normal judgment and thought content. CARDIOVASCULAR: Normal heart rate noted, regular rhythm RESPIRATORY: Clear to auscultation bilaterally. Effort and breath sounds normal, no problems with respiration noted. BREASTS: Symmetric in size. No masses, skin changes, nipple drainage, or lymphadenopathy. ABDOMEN: Soft, normal bowel sounds, no distention noted.  No tenderness, rebound or guarding.  PELVIC: Normal appearing external genitalia; normal appearing vaginal mucosa and cervix.  No abnormal discharge noted.  Pap smear obtained.  Normal uterine size, no other  palpable masses, no uterine or adnexal tenderness. MUSCULOSKELETAL: Normal range of motion. No tenderness.  No cyanosis, clubbing, or edema.  2+ distal pulses.  Assessment:  Annual gynecologic examination with pap smear Vaginal discharge  - suggested monthly tea tree tampon 1-2 days after cycle and use of daily probiotic to keep vaginal flora balanced. - also encouraged her to talk to partner about diet and hydration to improve the pH of his semen, but reassured that semen is normally alkaline and can shift vaginal flora    Plan:  Will follow up results of pap smear and manage accordingly. Routine preventative health maintenance measures emphasized. Please refer to After Visit Summary for other counseling recommendations.   Edd Arbour, CNM, MSN, IBCLC Certified Nurse Midwife, [facility]

## 2021-02-21 ENCOUNTER — Other Ambulatory Visit (HOSPITAL_COMMUNITY): Payer: Self-pay

## 2021-02-21 DIAGNOSIS — N760 Acute vaginitis: Secondary | ICD-10-CM

## 2021-02-21 DIAGNOSIS — B3731 Acute candidiasis of vulva and vagina: Secondary | ICD-10-CM

## 2021-02-21 DIAGNOSIS — B9689 Other specified bacterial agents as the cause of diseases classified elsewhere: Secondary | ICD-10-CM

## 2021-02-21 DIAGNOSIS — B373 Candidiasis of vulva and vagina: Secondary | ICD-10-CM

## 2021-02-21 LAB — CERVICOVAGINAL ANCILLARY ONLY
Bacterial Vaginitis (gardnerella): POSITIVE — AB
Candida Glabrata: NEGATIVE
Candida Vaginitis: NEGATIVE
Chlamydia: NEGATIVE
Comment: NEGATIVE
Comment: NEGATIVE
Comment: NEGATIVE
Comment: NEGATIVE
Comment: NEGATIVE
Comment: NORMAL
Neisseria Gonorrhea: NEGATIVE
Trichomonas: NEGATIVE

## 2021-02-21 LAB — CYTOLOGY - PAP: Diagnosis: NEGATIVE

## 2021-02-21 MED ORDER — CARESTART COVID-19 HOME TEST VI KIT
PACK | 0 refills | Status: DC
  Filled 2021-02-21: qty 4, 4d supply, fill #0

## 2021-02-23 MED ORDER — FLUCONAZOLE 150 MG PO TABS
150.0000 mg | ORAL_TABLET | Freq: Every day | ORAL | 1 refills | Status: DC
Start: 2021-02-23 — End: 2021-04-24
  Filled 2021-02-24: qty 1, 1d supply, fill #0

## 2021-02-23 MED ORDER — METRONIDAZOLE 0.75 % VA GEL
1.0000 | Freq: Two times a day (BID) | VAGINAL | 0 refills | Status: DC
  Filled 2021-02-24: qty 70, 7d supply, fill #0

## 2021-02-24 ENCOUNTER — Other Ambulatory Visit (HOSPITAL_COMMUNITY): Payer: Self-pay

## 2021-02-24 ENCOUNTER — Ambulatory Visit: Payer: No Typology Code available for payment source

## 2021-03-05 ENCOUNTER — Ambulatory Visit (INDEPENDENT_AMBULATORY_CARE_PROVIDER_SITE_OTHER): Payer: No Typology Code available for payment source | Admitting: Family Medicine

## 2021-03-06 ENCOUNTER — Other Ambulatory Visit (HOSPITAL_COMMUNITY): Payer: Self-pay

## 2021-03-06 MED ORDER — WEGOVY 2.4 MG/0.75ML ~~LOC~~ SOAJ
2.4000 mg | SUBCUTANEOUS | 3 refills | Status: DC
  Filled 2021-03-06 – 2021-03-18 (×5): qty 3, 28d supply, fill #0
  Filled 2021-04-18: qty 3, 28d supply, fill #1

## 2021-03-06 MED FILL — Norgestimate-Eth Estrad Tab 0.18-35/0.215-35/0.25-35 MG-MCG: ORAL | 28 days supply | Qty: 28 | Fill #1 | Status: AC

## 2021-03-12 ENCOUNTER — Other Ambulatory Visit (HOSPITAL_COMMUNITY): Payer: Self-pay

## 2021-03-14 ENCOUNTER — Other Ambulatory Visit (HOSPITAL_COMMUNITY): Payer: Self-pay

## 2021-03-14 MED ORDER — PAXLOVID 20 X 150 MG & 10 X 100MG PO TBPK
30.0000 | ORAL_TABLET | Freq: Two times a day (BID) | ORAL | 0 refills | Status: DC
  Filled 2021-03-14: qty 30, 5d supply, fill #0

## 2021-03-18 ENCOUNTER — Other Ambulatory Visit (HOSPITAL_COMMUNITY): Payer: Self-pay

## 2021-03-20 ENCOUNTER — Other Ambulatory Visit (HOSPITAL_COMMUNITY): Payer: Self-pay

## 2021-03-20 DIAGNOSIS — B9689 Other specified bacterial agents as the cause of diseases classified elsewhere: Secondary | ICD-10-CM

## 2021-03-20 DIAGNOSIS — N760 Acute vaginitis: Secondary | ICD-10-CM

## 2021-03-20 MED ORDER — PROAIR RESPICLICK 108 (90 BASE) MCG/ACT IN AEPB
1.0000 | INHALATION_SPRAY | RESPIRATORY_TRACT | 1 refills | Status: DC
  Filled 2021-03-20: qty 1, 33d supply, fill #0

## 2021-03-21 ENCOUNTER — Other Ambulatory Visit (HOSPITAL_COMMUNITY): Payer: Self-pay

## 2021-03-21 MED ORDER — METRONIDAZOLE 0.75 % VA GEL
1.0000 | Freq: Two times a day (BID) | VAGINAL | 0 refills | Status: DC
  Filled 2021-03-21: qty 70, 4d supply, fill #0

## 2021-03-21 MED ORDER — ALBUTEROL SULFATE HFA 108 (90 BASE) MCG/ACT IN AERS
2.0000 | INHALATION_SPRAY | RESPIRATORY_TRACT | 1 refills | Status: DC | PRN
  Filled 2021-03-21: qty 18, 17d supply, fill #0

## 2021-03-22 ENCOUNTER — Other Ambulatory Visit (HOSPITAL_COMMUNITY): Payer: Self-pay

## 2021-03-24 ENCOUNTER — Other Ambulatory Visit (HOSPITAL_COMMUNITY): Payer: Self-pay

## 2021-03-24 ENCOUNTER — Encounter (INDEPENDENT_AMBULATORY_CARE_PROVIDER_SITE_OTHER): Payer: Self-pay

## 2021-03-26 ENCOUNTER — Ambulatory Visit (INDEPENDENT_AMBULATORY_CARE_PROVIDER_SITE_OTHER): Payer: No Typology Code available for payment source | Admitting: Family Medicine

## 2021-03-29 ENCOUNTER — Encounter (INDEPENDENT_AMBULATORY_CARE_PROVIDER_SITE_OTHER): Payer: Self-pay | Admitting: Family Medicine

## 2021-04-03 ENCOUNTER — Other Ambulatory Visit (HOSPITAL_COMMUNITY): Payer: Self-pay

## 2021-04-03 MED ORDER — SODIUM FLUORIDE 5000 SENSITIVE 1.1-5 % DT GEL
DENTAL | 2 refills | Status: DC
  Filled 2021-04-03: qty 100, 20d supply, fill #0
  Filled 2021-05-08: qty 100, 20d supply, fill #1
  Filled 2021-08-25: qty 100, 20d supply, fill #2

## 2021-04-10 ENCOUNTER — Other Ambulatory Visit: Payer: Self-pay | Admitting: Certified Nurse Midwife

## 2021-04-10 ENCOUNTER — Other Ambulatory Visit (HOSPITAL_COMMUNITY): Payer: Self-pay

## 2021-04-10 DIAGNOSIS — Z3041 Encounter for surveillance of contraceptive pills: Secondary | ICD-10-CM

## 2021-04-10 MED ORDER — NORGESTIM-ETH ESTRAD TRIPHASIC 0.18/0.215/0.25 MG-35 MCG PO TABS
1.0000 | ORAL_TABLET | Freq: Every day | ORAL | 3 refills | Status: DC
  Filled 2021-04-10: qty 84, 84d supply, fill #0

## 2021-04-10 MED ORDER — NORGESTIM-ETH ESTRAD TRIPHASIC 0.18/0.215/0.25 MG-35 MCG PO TABS
1.0000 | ORAL_TABLET | Freq: Every day | ORAL | 12 refills | Status: DC
  Filled 2021-04-10: qty 84, 84d supply, fill #0
  Filled 2021-06-25: qty 84, 84d supply, fill #1
  Filled 2021-09-24: qty 84, 84d supply, fill #2
  Filled 2021-12-17: qty 84, 84d supply, fill #3
  Filled 2022-03-03 – 2022-03-11 (×2): qty 28, 28d supply, fill #4

## 2021-04-10 NOTE — Progress Notes (Signed)
Refill of birth control needed, pt has been evaluated within the past year.

## 2021-04-15 ENCOUNTER — Other Ambulatory Visit (HOSPITAL_COMMUNITY): Payer: Self-pay

## 2021-04-18 ENCOUNTER — Other Ambulatory Visit (HOSPITAL_COMMUNITY): Payer: Self-pay

## 2021-04-24 ENCOUNTER — Encounter (INDEPENDENT_AMBULATORY_CARE_PROVIDER_SITE_OTHER): Payer: Self-pay | Admitting: Family Medicine

## 2021-04-24 ENCOUNTER — Other Ambulatory Visit: Payer: Self-pay

## 2021-04-24 ENCOUNTER — Ambulatory Visit (INDEPENDENT_AMBULATORY_CARE_PROVIDER_SITE_OTHER): Payer: No Typology Code available for payment source | Admitting: Family Medicine

## 2021-04-24 VITALS — BP 113/77 | HR 82 | Temp 97.5°F | Ht 68.0 in | Wt 180.0 lb

## 2021-04-24 DIAGNOSIS — Z6830 Body mass index (BMI) 30.0-30.9, adult: Secondary | ICD-10-CM

## 2021-04-24 DIAGNOSIS — E669 Obesity, unspecified: Secondary | ICD-10-CM | POA: Diagnosis not present

## 2021-04-24 DIAGNOSIS — E559 Vitamin D deficiency, unspecified: Secondary | ICD-10-CM

## 2021-05-01 NOTE — Progress Notes (Signed)
Chief Complaint:   OBESITY Emily Miller is here to discuss her progress with her obesity treatment plan along with follow-up of her obesity related diagnoses. Emily Miller is on the Category 1 Plan and the Category 2 Plan and states she is following her eating plan approximately 90-95% of the time. Emily Miller states she is not currently exercising.  Today's visit was #: 6 Starting weight: 203 lbs Starting date: 10/16/2020 Today's weight: 180 lbs Today's date: 04/24/2021 Total lbs lost to date: 23 Total lbs lost since last in-office visit: 8  Interim History: Emily Miller has been somewhat slack with meal plan. She took a bit of a break from March to May. She started lifting weights in June and weight increased 7 lbs so she stopped lifting. She is wondering what plan she should do as she is remotely hungry. She is going to [place] a bit in a few weeks.  Subjective:   1. Vitamin D deficiency Elfrieda is on OTC Vit D. Her last Vit D level was 75.3.  Assessment/Plan:   1. Vitamin D deficiency Low Vitamin D level contributes to fatigue and are associated with obesity, breast, and colon cancer. She agrees to continue to take OTC  Vitamin D daily and will follow-up for routine testing of Vitamin D, at least 2-3 times per year to avoid over-replacement.  2. Class 1 obesity with serious comorbidity and body mass index (BMI) of 30.0 to 30.9 in adult, unspecified obesity type  Emily Miller is currently in the action stage of change. As such, her goal is to continue with weight loss efforts. She has agreed to the Category 2 Plan.   Exercise goals:  Add resistance training 10-15 minutes 2-3 times a week.  Behavioral modification strategies: increasing lean protein intake, meal planning and cooking strategies, keeping healthy foods in the home, and planning for success.  Darion has agreed to follow-up with our clinic in 2 weeks. She was informed of the importance of frequent follow-up visits to maximize her  success with intensive lifestyle modifications for her multiple health conditions.   Objective:   Blood pressure 113/77, pulse 82, temperature (!) 97.5 F (36.4 C), height 5\' 8"  (1.727 m), weight 180 lb (81.6 kg), SpO2 100 %. Body mass index is 27.37 kg/m.  General: Cooperative, alert, well developed, in no acute distress. HEENT: Conjunctivae and lids unremarkable. Cardiovascular: Regular rhythm.  Lungs: Normal work of breathing. Neurologic: No focal deficits.   Lab Results  Component Value Date   CREATININE 0.70 10/16/2020   BUN 8 10/16/2020   NA 139 10/16/2020   K 4.5 10/16/2020   CL 104 10/16/2020   CO2 24 10/16/2020   Lab Results  Component Value Date   ALT 40 (H) 10/16/2020   AST 23 10/16/2020   ALKPHOS 81 10/16/2020   BILITOT <0.2 10/16/2020   Lab Results  Component Value Date   HGBA1C 5.0 10/16/2020   Lab Results  Component Value Date   INSULIN 8.8 10/16/2020   Lab Results  Component Value Date   TSH 0.392 (L) 10/16/2020   Lab Results  Component Value Date   CHOL 176 10/16/2020   HDL 74 10/16/2020   LDLCALC 86 10/16/2020   TRIG 86 10/16/2020   Lab Results  Component Value Date   VD25OH 75.3 10/16/2020   Lab Results  Component Value Date   WBC 7.8 10/16/2020   HGB 13.2 10/16/2020   HCT 39.3 10/16/2020   MCV 88 10/16/2020   PLT 351 10/16/2020  No results found for: IRON, TIBC, FERRITIN  Attestation Statements:   Reviewed by clinician on day of visit: allergies, medications, problem list, medical history, surgical history, family history, social history, and previous encounter notes.  Edmund Hilda, CMA, am acting as transcriptionist for Reuben Likes, MD.   I have reviewed the above documentation for accuracy and completeness, and I agree with the above. - Reuben Likes, MD

## 2021-05-06 ENCOUNTER — Other Ambulatory Visit (HOSPITAL_COMMUNITY): Payer: Self-pay

## 2021-05-07 ENCOUNTER — Other Ambulatory Visit (HOSPITAL_COMMUNITY): Payer: Self-pay

## 2021-05-08 ENCOUNTER — Encounter (INDEPENDENT_AMBULATORY_CARE_PROVIDER_SITE_OTHER): Payer: Self-pay | Admitting: Family Medicine

## 2021-05-08 ENCOUNTER — Other Ambulatory Visit: Payer: Self-pay

## 2021-05-08 ENCOUNTER — Other Ambulatory Visit (HOSPITAL_COMMUNITY): Payer: Self-pay

## 2021-05-08 ENCOUNTER — Ambulatory Visit (INDEPENDENT_AMBULATORY_CARE_PROVIDER_SITE_OTHER): Payer: No Typology Code available for payment source | Admitting: Family Medicine

## 2021-05-08 VITALS — BP 97/65 | HR 97 | Temp 98.4°F | Ht 68.0 in | Wt 177.0 lb

## 2021-05-08 DIAGNOSIS — R7401 Elevation of levels of liver transaminase levels: Secondary | ICD-10-CM

## 2021-05-08 DIAGNOSIS — Z6830 Body mass index (BMI) 30.0-30.9, adult: Secondary | ICD-10-CM | POA: Diagnosis not present

## 2021-05-08 DIAGNOSIS — E669 Obesity, unspecified: Secondary | ICD-10-CM

## 2021-05-12 NOTE — Progress Notes (Signed)
Chief Complaint:   OBESITY Emily Miller is here to discuss her progress with her obesity treatment plan along with follow-up of her obesity related diagnoses. Emily Miller is on the Category 2 Plan and states she is following her eating plan approximately 90% of the time. Emily Miller states she is not currently exercising.  Today's visit was #: 7 Starting weight: 203 lbs Starting date: 10/16/2020 Today's weight: 177 lbs Today's date: 7/282022 Total lbs lost to date: 26 Total lbs lost since last in-office visit: 3  Interim History: Emily Miller is traveling tomorrow morning for a trip to [place] (going away for 2 weeks). She is planning on sight seeing and relaxing. She has been doing really well on category 2 but had a harder time incorporating physical activity- she wants to start when she gets back.  Subjective:   1. Transaminitis Emily Miller's last ALT was 40 and AST was within normal limits at 23.  Assessment/Plan:   1. Transaminitis Repeat labs in 1-2 months.  2. Obesity with current BMI of 27.0  Emily Miller is currently in the action stage of change. As such, her goal is to continue with weight loss efforts. She has agreed to the Category 2 Plan.   Continue [id number].  Exercise goals: All adults should avoid inactivity. Some physical activity is better than none, and adults who participate in any amount of physical activity gain some health benefits. Start resistance training 2-3 times a week.  Behavioral modification strategies: increasing lean protein intake, meal planning and cooking strategies, keeping healthy foods in the home, and planning for success.  Emily Miller has agreed to follow-up with our clinic in 3-4 weeks. She was informed of the importance of frequent follow-up visits to maximize her success with intensive lifestyle modifications for her multiple health conditions.   Objective:   Blood pressure 97/65, pulse 97, temperature 98.4 F (36.9 C), height 5\' 8"  (1.727 m), weight 177  lb (80.3 kg), last menstrual period 05/05/2021, SpO2 100 %. Body mass index is 26.91 kg/m.  General: Cooperative, alert, well developed, in no acute distress. HEENT: Conjunctivae and lids unremarkable. Cardiovascular: Regular rhythm.  Lungs: Normal work of breathing. Neurologic: No focal deficits.   Lab Results  Component Value Date   CREATININE 0.70 10/16/2020   BUN 8 10/16/2020   NA 139 10/16/2020   K 4.5 10/16/2020   CL 104 10/16/2020   CO2 24 10/16/2020   Lab Results  Component Value Date   ALT 40 (H) 10/16/2020   AST 23 10/16/2020   ALKPHOS 81 10/16/2020   BILITOT <0.2 10/16/2020   Lab Results  Component Value Date   HGBA1C 5.0 10/16/2020   Lab Results  Component Value Date   INSULIN 8.8 10/16/2020   Lab Results  Component Value Date   TSH 0.392 (L) 10/16/2020   Lab Results  Component Value Date   CHOL 176 10/16/2020   HDL 74 10/16/2020   LDLCALC 86 10/16/2020   TRIG 86 10/16/2020   Lab Results  Component Value Date   VD25OH 75.3 10/16/2020   Lab Results  Component Value Date   WBC 7.8 10/16/2020   HGB 13.2 10/16/2020   HCT 39.3 10/16/2020   MCV 88 10/16/2020   PLT 351 10/16/2020    Attestation Statements:   Reviewed by clinician on day of visit: allergies, medications, problem list, medical history, surgical history, family history, social history, and previous encounter notes.  Time spent on visit including pre-visit chart review and post-visit care and charting was 12  minutes.   Edmund Hilda, CMA, am acting as transcriptionist for Reuben Likes, MD.  I have reviewed the above documentation for accuracy and completeness, and I agree with the above. - Reuben Likes, MD

## 2021-05-28 ENCOUNTER — Encounter: Payer: Self-pay | Admitting: Certified Nurse Midwife

## 2021-06-11 ENCOUNTER — Ambulatory Visit (INDEPENDENT_AMBULATORY_CARE_PROVIDER_SITE_OTHER): Payer: No Typology Code available for payment source | Admitting: Family Medicine

## 2021-06-11 ENCOUNTER — Encounter (INDEPENDENT_AMBULATORY_CARE_PROVIDER_SITE_OTHER): Payer: Self-pay | Admitting: Family Medicine

## 2021-06-11 ENCOUNTER — Other Ambulatory Visit: Payer: Self-pay

## 2021-06-11 ENCOUNTER — Other Ambulatory Visit (HOSPITAL_COMMUNITY): Payer: Self-pay

## 2021-06-11 VITALS — BP 101/66 | HR 85 | Temp 97.6°F | Ht 68.0 in | Wt 174.0 lb

## 2021-06-11 DIAGNOSIS — E559 Vitamin D deficiency, unspecified: Secondary | ICD-10-CM

## 2021-06-11 DIAGNOSIS — Z6830 Body mass index (BMI) 30.0-30.9, adult: Secondary | ICD-10-CM | POA: Diagnosis not present

## 2021-06-11 DIAGNOSIS — E669 Obesity, unspecified: Secondary | ICD-10-CM | POA: Diagnosis not present

## 2021-06-11 NOTE — Progress Notes (Signed)
Chief Complaint:   OBESITY Emily Miller is here to discuss her progress with her obesity treatment plan along with follow-up of her obesity related diagnoses. Emily Miller is on the Category 2 Plan and states she is following her eating plan approximately 85% of the time. Emily Miller states she is weight training 60 minutes 2-3 times per week.  Today's visit was #: 8 Starting weight: 203 lbs Starting date: 10/16/2020 Today's weight: 174 lbs Today's date: 06/11/2021 Total lbs lost to date: 29 Total lbs lost since last in-office visit: 3  Interim History: Emily Miller got camelbacks for her and her family trip to [place]. She ate a combo of regular food and [place] food. She got back on August 12. Work has been very busy. She has no plans for the fall. Pt denies hunger or cravings. She joined [employer].  Subjective:   1. Vitamin D deficiency Pt denies nausea, vomiting, and muscle weakness but notes fatigue. She is on combo of Vit D and calcium.  Assessment/Plan:   1. Vitamin D deficiency Low Vitamin D level contributes to fatigue and are associated with obesity, breast, and colon cancer. She agrees to continue to take OTC Vitamin D/Calcium and will follow-up for routine testing of Vitamin D, at least 2-3 times per year to avoid over-replacement.  2. Obesity with current BMI of 30.87  Emily Miller is currently in the action stage of change. As such, her goal is to continue with weight loss efforts. She has agreed to the Category 2 Plan.   Exercise goals:  Continue weight training at [employer].  Behavioral modification strategies: increasing lean protein intake, meal planning and cooking strategies, keeping healthy foods in the home, and planning for success.  Emily Miller has agreed to follow-up with our clinic in 5-6 weeks. She was informed of the importance of frequent follow-up visits to maximize her success with intensive lifestyle modifications for her multiple health conditions.   Objective:    Blood pressure 101/66, pulse 85, temperature 97.6 F (36.4 C), height 5\' 8"  (1.727 m), weight 174 lb (78.9 kg), last menstrual period 06/03/2021, SpO2 100 %. Body mass index is 26.46 kg/m.  General: Cooperative, alert, well developed, in no acute distress. HEENT: Conjunctivae and lids unremarkable. Cardiovascular: Regular rhythm.  Lungs: Normal work of breathing. Neurologic: No focal deficits.   Lab Results  Component Value Date   CREATININE 0.70 10/16/2020   BUN 8 10/16/2020   NA 139 10/16/2020   K 4.5 10/16/2020   CL 104 10/16/2020   CO2 24 10/16/2020   Lab Results  Component Value Date   ALT 40 (H) 10/16/2020   AST 23 10/16/2020   ALKPHOS 81 10/16/2020   BILITOT <0.2 10/16/2020   Lab Results  Component Value Date   HGBA1C 5.0 10/16/2020   Lab Results  Component Value Date   INSULIN 8.8 10/16/2020   Lab Results  Component Value Date   TSH 0.392 (L) 10/16/2020   Lab Results  Component Value Date   CHOL 176 10/16/2020   HDL 74 10/16/2020   LDLCALC 86 10/16/2020   TRIG 86 10/16/2020   Lab Results  Component Value Date   VD25OH 75.3 10/16/2020   Lab Results  Component Value Date   WBC 7.8 10/16/2020   HGB 13.2 10/16/2020   HCT 39.3 10/16/2020   MCV 88 10/16/2020   PLT 351 10/16/2020    Attestation Statements:   Reviewed by clinician on day of visit: allergies, medications, problem list, medical history, surgical history, family  history, social history, and previous encounter notes.  Edmund Hilda, CMA, am acting as transcriptionist for Reuben Likes, MD.   I have reviewed the above documentation for accuracy and completeness, and I agree with the above. - Reuben Likes, MD

## 2021-06-25 ENCOUNTER — Other Ambulatory Visit (HOSPITAL_COMMUNITY): Payer: Self-pay

## 2021-06-27 ENCOUNTER — Other Ambulatory Visit: Payer: Self-pay

## 2021-06-27 ENCOUNTER — Other Ambulatory Visit (HOSPITAL_COMMUNITY)
Admission: RE | Admit: 2021-06-27 | Discharge: 2021-06-27 | Disposition: A | Discharge status: Home / Self Care | Payer: No Typology Code available for payment source | Source: Ambulatory Visit | Attending: Certified Nurse Midwife | Admitting: Certified Nurse Midwife

## 2021-06-27 ENCOUNTER — Ambulatory Visit (INDEPENDENT_AMBULATORY_CARE_PROVIDER_SITE_OTHER): Payer: Self-pay

## 2021-06-27 VITALS — BP 96/67 | HR 98 | Temp 97.9°F | Wt 177.2 lb

## 2021-06-27 DIAGNOSIS — N898 Other specified noninflammatory disorders of vagina: Secondary | ICD-10-CM | POA: Diagnosis present

## 2021-06-27 DIAGNOSIS — B3731 Acute candidiasis of vulva and vagina: Secondary | ICD-10-CM

## 2021-06-27 DIAGNOSIS — B373 Candidiasis of vulva and vagina: Secondary | ICD-10-CM

## 2021-06-27 NOTE — Progress Notes (Signed)
    SUBJECTIVE:  35 y.o. female complains of white and creamy vaginal discharge for serveral week(s). Denies abnormal vaginal bleeding or significant pelvic pain or fever. No UTI symptoms. Denies history of known exposure to STD.  Patient's last menstrual period was 06/03/2021.  OBJECTIVE:  She appears well, afebrile. Urine dipstick: not done.  ASSESSMENT:  Vaginal Discharge     PLAN:  GC, chlamydia, trichomonas, BVAG, CVAG probe sent to lab. Treatment: To be determined once lab results are received ROV prn if symptoms persist or worsen.  Self Swab done   Amylynn Fano Ladona Horns, CMA AAMA

## 2021-06-30 LAB — CERVICOVAGINAL ANCILLARY ONLY
Bacterial Vaginitis (gardnerella): NEGATIVE
Candida Glabrata: NEGATIVE
Candida Vaginitis: NEGATIVE
Comment: NEGATIVE
Comment: NEGATIVE
Comment: NEGATIVE

## 2021-07-10 ENCOUNTER — Other Ambulatory Visit (HOSPITAL_COMMUNITY): Payer: Self-pay

## 2021-07-16 ENCOUNTER — Ambulatory Visit (INDEPENDENT_AMBULATORY_CARE_PROVIDER_SITE_OTHER): Payer: No Typology Code available for payment source | Admitting: Family Medicine

## 2021-07-31 ENCOUNTER — Ambulatory Visit (INDEPENDENT_AMBULATORY_CARE_PROVIDER_SITE_OTHER): Payer: No Typology Code available for payment source | Admitting: Family Medicine

## 2021-08-15 ENCOUNTER — Other Ambulatory Visit (HOSPITAL_COMMUNITY): Payer: Self-pay

## 2021-08-21 ENCOUNTER — Other Ambulatory Visit (HOSPITAL_COMMUNITY): Payer: Self-pay

## 2021-08-22 ENCOUNTER — Other Ambulatory Visit (HOSPITAL_COMMUNITY): Payer: Self-pay

## 2021-08-22 MED ORDER — HYDROXYCHLOROQUINE SULFATE 200 MG PO TABS
400.0000 mg | ORAL_TABLET | Freq: Every day | ORAL | 1 refills | Status: DC
  Filled 2021-08-22: qty 180, 90d supply, fill #0
  Filled 2021-12-17: qty 180, 90d supply, fill #1

## 2021-08-25 ENCOUNTER — Telehealth: Payer: No Typology Code available for payment source | Admitting: Physician Assistant

## 2021-08-25 ENCOUNTER — Other Ambulatory Visit (HOSPITAL_COMMUNITY): Payer: Self-pay

## 2021-08-25 DIAGNOSIS — J208 Acute bronchitis due to other specified organisms: Secondary | ICD-10-CM | POA: Diagnosis not present

## 2021-08-25 DIAGNOSIS — B9689 Other specified bacterial agents as the cause of diseases classified elsewhere: Secondary | ICD-10-CM | POA: Diagnosis not present

## 2021-08-25 DIAGNOSIS — B379 Candidiasis, unspecified: Secondary | ICD-10-CM | POA: Diagnosis not present

## 2021-08-25 DIAGNOSIS — T3695XA Adverse effect of unspecified systemic antibiotic, initial encounter: Secondary | ICD-10-CM | POA: Diagnosis not present

## 2021-08-25 MED ORDER — AZITHROMYCIN 250 MG PO TABS
ORAL_TABLET | ORAL | 0 refills | Status: DC
  Filled 2021-08-25: qty 6, 5d supply, fill #0

## 2021-08-25 MED ORDER — BENZONATATE 100 MG PO CAPS
100.0000 mg | ORAL_CAPSULE | Freq: Three times a day (TID) | ORAL | 0 refills | Status: DC | PRN
  Filled 2021-08-25: qty 30, 10d supply, fill #0

## 2021-08-25 MED ORDER — FLUCONAZOLE 150 MG PO TABS
150.0000 mg | ORAL_TABLET | Freq: Once | ORAL | 0 refills | Status: AC
Start: 2021-08-25 — End: 2021-08-26
  Filled 2021-08-25: qty 1, 1d supply, fill #0

## 2021-08-25 MED ORDER — PREDNISONE 10 MG (21) PO TBPK
ORAL_TABLET | ORAL | 0 refills | Status: DC
  Filled 2021-08-25: qty 21, 6d supply, fill #0

## 2021-08-25 NOTE — Progress Notes (Signed)

## 2021-08-25 NOTE — Addendum Note (Signed)
Addended by: Freddy Finner on: 08/25/2021 10:04 AM   Modules accepted: Orders

## 2021-08-27 ENCOUNTER — Other Ambulatory Visit (HOSPITAL_COMMUNITY): Payer: Self-pay

## 2021-08-27 MED ORDER — WEGOVY 2.4 MG/0.75ML ~~LOC~~ SOAJ
2.4000 mg | SUBCUTANEOUS | 6 refills | Status: DC
  Filled 2021-08-27 – 2021-09-12 (×2): qty 3, 28d supply, fill #0
  Filled 2021-10-07 – 2021-10-09 (×2): qty 3, 28d supply, fill #1
  Filled 2021-11-11 (×2): qty 3, 28d supply, fill #2

## 2021-09-02 ENCOUNTER — Telehealth: Payer: No Typology Code available for payment source | Admitting: Physician Assistant

## 2021-09-02 ENCOUNTER — Emergency Department (INDEPENDENT_AMBULATORY_CARE_PROVIDER_SITE_OTHER)
Admission: EM | Admit: 2021-09-02 | Discharge: 2021-09-02 | Disposition: A | Discharge status: Home / Self Care | Payer: No Typology Code available for payment source | Source: Home / Self Care | Attending: Family Medicine | Admitting: Family Medicine

## 2021-09-02 DIAGNOSIS — R051 Acute cough: Secondary | ICD-10-CM

## 2021-09-02 DIAGNOSIS — J069 Acute upper respiratory infection, unspecified: Secondary | ICD-10-CM

## 2021-09-02 MED ORDER — FLUCONAZOLE 150 MG PO TABS: 150.0000 mg | ORAL_TABLET | Freq: Every day | ORAL | 0 refills | Status: DC

## 2021-09-02 MED ORDER — CLARITHROMYCIN 500 MG PO TABS: 500.0000 mg | ORAL_TABLET | Freq: Two times a day (BID) | ORAL | 0 refills | Status: DC

## 2021-09-02 MED ORDER — BENZONATATE 200 MG PO CAPS: 200.0000 mg | ORAL_CAPSULE | Freq: Three times a day (TID) | ORAL | 0 refills | Status: DC | PRN

## 2021-09-02 MED ORDER — PREDNISONE 20 MG PO TABS: 20.0000 mg | ORAL_TABLET | Freq: Two times a day (BID) | ORAL | 0 refills | Status: DC

## 2021-09-02 NOTE — ED Provider Notes (Signed)
[facility]    CSN: [id number] Arrival date & time: 09/02/21  1710      History   Chief Complaint Chief Complaint  Patient presents with   Cough    W/ chest tightness    HPI Emily Miller is a 35 y.o. female.   HPI Patient had an E-visit for cough.  She was diagnosed as having acute bacterial bronchitis.  She got azithromycin, prednisone, and Tessalon 100 mg.  She felt better while on this medication but as soon as she got off of it the cough resumed.  Her chest felt tight.  She started using her albuterol again.  She felt like the infection was coming back.  She is here because she had another ED visit today and they recommended that she be seen in person.  Past Medical History:  Diagnosis Date   Anemia    Asthma    As a child. Not treated daily as an adult   Back pain    Constipation    Hemorrhoid    Insulin resistance    Joint pain    Multiple food allergies    Osteoarthritis    Spondylolisthesis    Vitamin D deficiency     Patient Active Problem List   Diagnosis Date Noted   Asthma 08/26/2014   Anemia 08/26/2014    Past Surgical History:  Procedure Laterality Date   TONSILECTOMY/ADENOIDECTOMY WITH MYRINGOTOMY      OB History     Gravida  4   Para  3   Term  3   Preterm  0   AB  1   Living  0      SAB  1   IAB  0   Ectopic  0   Multiple  0   Live Births  0            Home Medications    Prior to Admission medications   Medication Sig Start Date End Date Taking? Authorizing Provider  benzonatate (TESSALON) 200 MG capsule Take 1 capsule (200 mg total) by mouth 3 (three) times daily as needed for cough. 09/02/21  Yes Eustace Moore, MD  clarithromycin (BIAXIN) 500 MG tablet Take 1 tablet (500 mg total) by mouth 2 (two) times daily. 09/02/21  Yes Eustace Moore, MD  fluconazole (DIFLUCAN) 150 MG tablet Take 1 tablet (150 mg total) by mouth daily. Repeat in 1 week if needed 09/02/21  Yes Eustace Moore, MD   predniSONE (DELTASONE) 20 MG tablet Take 1 tablet (20 mg total) by mouth 2 (two) times daily with a meal. 09/02/21  Yes Eustace Moore, MD  ASHWAGANDHA PO Take 300 mg by mouth.    [provider]  hydroxychloroquine (PLAQUENIL) 200 MG tablet Take 2 tablets (400 mg total) by mouth daily. 08/22/21     MELATONIN ER PO Take 6 mg by mouth.    [provider]  nabumetone (RELAFEN) 500 MG tablet Take 500 mg by mouth daily.    [provider]  Norgestimate-Ethinyl Estradiol Triphasic 0.18/0.215/0.25 MG-35 MCG tablet Take 1 tablet by mouth daily. 04/10/21 04/10/22  Bernerd Limbo, CNM  Semaglutide-Weight Management (WEGOVY) 2.4 MG/0.75ML SOAJ Inject 2.4 mg as directed once a week. 08/27/21     VITAMIN D PO Take by mouth. With K2 125 mcg/18mcg    [provider]    Family History Family History  Problem Relation Age of Onset   Anxiety disorder Mother    Obesity Father  Social History Social History   Tobacco Use   Smoking status: Never   Smokeless tobacco: Never  Vaping Use   Vaping Use: Never used  Substance Use Topics   Alcohol use: Yes    Comment: soically   Drug use: Never     Allergies   Gluten meal   Review of Systems Review of Systems See HPI  Physical Exam Triage Vital Signs ED Triage Vitals  Enc Vitals Group     BP 09/02/21 1725 118/75     Pulse Rate 09/02/21 1725 99     Resp 09/02/21 1725 14     Temp 09/02/21 1725 98.7 F (37.1 C)     Temp Source 09/02/21 1725 Oral     SpO2 09/02/21 1725 100 %     Weight --      Height --      Head Circumference --      Peak Flow --      Pain Score 09/02/21 1728 0     Pain Loc --      Pain Edu? --      Excl. in GC? --    No data found.  Updated Vital Signs BP 118/75 (BP Location: Right Arm)   Pulse 99   Temp 98.7 F (37.1 C) (Oral)   Resp 14   LMP 08/25/2021 (Exact Date)   SpO2 100%      Physical Exam Constitutional:      General: She is not in acute distress.     Appearance: She is well-developed.  HENT:     Head: Normocephalic and atraumatic.     Right Ear: Tympanic membrane and ear canal normal.     Left Ear: Tympanic membrane and ear canal normal.     Nose: No congestion.     Mouth/Throat:     Mouth: Mucous membranes are moist.     Pharynx: No posterior oropharyngeal erythema.  Eyes:     Conjunctiva/sclera: Conjunctivae normal.     Pupils: Pupils are equal, round, and reactive to light.  Cardiovascular:     Rate and Rhythm: Normal rate and regular rhythm.  Pulmonary:     Effort: Pulmonary effort is normal. No respiratory distress.     Breath sounds: Rhonchi present.     Comments: Harsh cough.  Rhonchi Abdominal:     General: There is no distension.     Palpations: Abdomen is soft.  Musculoskeletal:        General: Normal range of motion.     Cervical back: Normal range of motion and neck supple.  Lymphadenopathy:     Cervical: No cervical adenopathy.  Skin:    General: Skin is warm and dry.  Neurological:     Mental Status: She is alert.  Psychiatric:        Mood and Affect: Mood normal.        Behavior: Behavior normal.     UC Treatments / Results  Labs (all labs ordered are listed, but only abnormal results are displayed) Labs Reviewed - No data to display  EKG   Radiology No results found.  Procedures Procedures (including critical care time)  Medications Ordered in UC Medications - No data to display  Initial Impression / Assessment and Plan / UC Course  I have reviewed the triage vital signs and the nursing notes.  Pertinent labs & imaging results that were available during my care of the patient were reviewed by me and considered in my medical decision making (see  chart for details).     Final Clinical Impressions(s) / UC Diagnoses   Final diagnoses:  Acute cough     Discharge Instructions      Drink lots of fluids Take the antibiotic as directed 2 times a day.  Take with food Take prednisone 2  times a day I have prescribed Tessalon to take for coughing.  I have prescribed the 200 mg dose as I think it is more effective than the one you are on I also prescribed Diflucan in case it is needed Call your doctor if not improving by next week   ED Prescriptions     Medication Sig Dispense Auth. Provider   clarithromycin (BIAXIN) 500 MG tablet Take 1 tablet (500 mg total) by mouth 2 (two) times daily. 14 tablet Eustace Moore, MD   predniSONE (DELTASONE) 20 MG tablet Take 1 tablet (20 mg total) by mouth 2 (two) times daily with a meal. 10 tablet Eustace Moore, MD   fluconazole (DIFLUCAN) 150 MG tablet Take 1 tablet (150 mg total) by mouth daily. Repeat in 1 week if needed 2 tablet Eustace Moore, MD   benzonatate (TESSALON) 200 MG capsule Take 1 capsule (200 mg total) by mouth 3 (three) times daily as needed for cough. 21 capsule Eustace Moore, MD      PDMP not reviewed this encounter.   Eustace Moore, MD 09/02/21 [phone]

## 2021-09-02 NOTE — Progress Notes (Signed)
Based on what you shared with me, I feel your condition warrants further evaluation and I recommend that you be seen in a face to face visit.  When you have failed a treatment we do highly recommend to be seen in person for further evaluation. You may require a chest xray to evaluate for pneumonia.   NOTE: There will be NO CHARGE for this eVisit   If you are having a true medical emergency please call 911.      For an urgent face to face visit, [facility] has six urgent care centers for your convenience:     [facility] Health Urgent [facility] Center at [facility] Directions [phone] [address] [place], [place] [address]    [facility] Health Urgent Care Center [facility]) Get Driving Directions [phone] [address] [place], [place] [address]  [facility] Urgent Care Center [facility]) Get Driving Directions [phone] [address] [place],  [place]  [address]  [facility] Health Urgent Care at [facility] Get Driving Directions [phone] [address] [place], [place] [address]   [facility] Health Urgent Care at [facility] Get Driving Directions  [phone] [address] [place], [place] [address]   [facility] Health Urgent Care at [facility] Directions [phone] [address], [address] [place], [place] [address]  Your MyChart E-visit questionnaire answers were reviewed by a board certified advanced clinical practitioner to complete your personal care plan based on your specific symptoms.  Thank you for using e-Visits.    I provided 5 minutes of non face-to-face time during this encounter for chart review and documentation.

## 2021-09-02 NOTE — ED Triage Notes (Signed)
Pt presents with cough and chest tightness that began on 11/8. Pt states that at that time she did an e-visit and received and antibiotic and steroid taper. Pt states that she has completed her prescriptions with little improvement. Pt states she is also experiencing diarrhea and vomiting. Neg covid test

## 2021-09-02 NOTE — Discharge Instructions (Signed)
Drink lots of fluids Take the antibiotic as directed 2 times a day.  Take with food Take prednisone 2 times a day I have prescribed Tessalon to take for coughing.  I have prescribed the 200 mg dose as I think it is more effective than the one you are on I also prescribed Diflucan in case it is needed Call your doctor if not improving by next week

## 2021-09-09 ENCOUNTER — Other Ambulatory Visit (HOSPITAL_COMMUNITY): Payer: Self-pay

## 2021-09-12 ENCOUNTER — Other Ambulatory Visit (HOSPITAL_COMMUNITY): Payer: Self-pay

## 2021-09-24 ENCOUNTER — Other Ambulatory Visit (HOSPITAL_COMMUNITY): Payer: Self-pay

## 2021-09-30 ENCOUNTER — Ambulatory Visit (INDEPENDENT_AMBULATORY_CARE_PROVIDER_SITE_OTHER): Payer: No Typology Code available for payment source

## 2021-09-30 ENCOUNTER — Other Ambulatory Visit: Payer: Self-pay

## 2021-09-30 ENCOUNTER — Encounter (HOSPITAL_COMMUNITY): Payer: Self-pay | Admitting: Emergency Medicine

## 2021-09-30 ENCOUNTER — Other Ambulatory Visit (HOSPITAL_COMMUNITY): Payer: Self-pay

## 2021-09-30 ENCOUNTER — Ambulatory Visit (HOSPITAL_COMMUNITY)
Admission: EM | Admit: 2021-09-30 | Discharge: 2021-09-30 | Disposition: A | Discharge status: Home / Self Care | Payer: No Typology Code available for payment source | Attending: Emergency Medicine | Admitting: Emergency Medicine

## 2021-09-30 DIAGNOSIS — S6710XA Crushing injury of unspecified finger(s), initial encounter: Secondary | ICD-10-CM

## 2021-09-30 DIAGNOSIS — S67191A Crushing injury of left index finger, initial encounter: Secondary | ICD-10-CM

## 2021-09-30 MED ORDER — MELOXICAM 7.5 MG PO TABS
7.5000 mg | ORAL_TABLET | Freq: Every day | ORAL | 0 refills | Status: DC
  Filled 2021-09-30: qty 30, 30d supply, fill #0

## 2021-09-30 NOTE — ED Provider Notes (Signed)
[facility]  ____________________________________________  Time seen: Approximately 3:29 PM  I have reviewed the triage vital signs and the nursing notes.   HISTORY  Chief Complaint Finger Injury   Historian Patient     HPI Emily Miller is a 35 y.o. female presents to the urgent care with left index finger pain after a contusion type injury.  Patient was moving furniture when her finger was sandwiched between the wheel and another piece of furniture.  Patient has been able to flex and extend digit easily.  She does have bruising along the dorsal aspect of the digit.  She has been applying ice but has attempted no other alleviating measures.   Past Medical History:  Diagnosis Date   Anemia    Asthma    As a child. Not treated daily as an adult   Hemorrhoid    Insulin resistance    Joint pain    Multiple food allergies    Osteoarthritis    Spondylolisthesis    Vitamin D deficiency      Immunizations up to date:  Yes.     Past Medical History:  Diagnosis Date   Anemia    Asthma    As a child. Not treated daily as an adult   Hemorrhoid    Insulin resistance    Joint pain    Multiple food allergies    Osteoarthritis    Spondylolisthesis    Vitamin D deficiency     Patient Active Problem List   Diagnosis Date Noted   Asthma 08/26/2014   Anemia 08/26/2014    Past Surgical History:  Procedure Laterality Date   TONSILECTOMY/ADENOIDECTOMY WITH MYRINGOTOMY      Prior to Admission medications   Medication Sig Start Date End Date Taking? Authorizing Provider  ASHWAGANDHA PO Take 300 mg by mouth.   Yes [provider]  hydroxychloroquine (PLAQUENIL) 200 MG tablet Take 2 tablets (400 mg total) by mouth daily. 08/22/21  Yes   MELATONIN ER PO Take 6 mg by mouth.   Yes [provider]  meloxicam (MOBIC) 7.5 MG tablet Take 1 tablet (7.5 mg total) by mouth daily. 09/30/21  Yes Pia Mau M, PA-C  Norgestimate-Ethinyl Estradiol  Triphasic 0.18/0.215/0.25 MG-35 MCG tablet Take 1 tablet by mouth daily. 04/10/21 04/10/22 Yes Bernerd Limbo, CNM  Semaglutide-Weight Management (WEGOVY) 2.4 MG/0.75ML SOAJ Inject 2.4 mg as directed once a week. 08/27/21  Yes   VITAMIN D PO Take by mouth. With K2 125 mcg/17mcg   Yes [provider]  benzonatate (TESSALON) 200 MG capsule Take 1 capsule (200 mg total) by mouth 3 (three) times daily as needed for cough. 09/02/21   Eustace Moore, MD  clarithromycin (BIAXIN) 500 MG tablet Take 1 tablet (500 mg total) by mouth 2 (two) times daily. 09/02/21   Eustace Moore, MD  fluconazole (DIFLUCAN) 150 MG tablet Take 1 tablet (150 mg total) by mouth daily. Repeat in 1 week if needed 09/02/21   Eustace Moore, MD  nabumetone (RELAFEN) 500 MG tablet Take 500 mg by mouth daily.    [provider]  predniSONE (DELTASONE) 20 MG tablet Take 1 tablet (20 mg total) by mouth 2 (two) times daily with a meal. 09/02/21   Eustace Moore, MD    Allergies Gluten meal  Family History  Problem Relation Age of Onset   Anxiety disorder Mother    Obesity Father     Social History Social History   Tobacco Use  Smoking status: Never   Smokeless tobacco: Never  Vaping Use   Vaping Use: Never used  Substance Use Topics   Alcohol use: Yes    Comment: soically   Drug use: Never     Review of Systems  Constitutional: No fever/chills Eyes:  No discharge ENT: No upper respiratory complaints. Respiratory: no cough. No SOB/ use of accessory muscles to breath Gastrointestinal:   No nausea, no vomiting.  No diarrhea.  No constipation. Musculoskeletal: Patient has left index finger pain.  Skin: Negative for rash, abrasions, lacerations, ecchymosis.    ____________________________________________   PHYSICAL EXAM:  VITAL SIGNS: ED Triage Vitals  Enc Vitals Group     BP 09/30/21 1416 109/72     Pulse Rate 09/30/21 1416 89     Resp 09/30/21 1416 12     Temp  09/30/21 1416 98.2 F (36.8 C)     Temp Source 09/30/21 1416 Oral     SpO2 09/30/21 1416 100 %     Weight --      Height --      Head Circumference --      Peak Flow --      Pain Score 09/30/21 1419 7     Pain Loc --      Pain Edu? --      Excl. in GC? --      Constitutional: Alert and oriented. Well appearing and in no acute distress. Eyes: Conjunctivae are normal. PERRL. EOMI. Head: Atraumatic. ENT: Cardiovascular: Normal rate, regular rhythm. Normal S1 and S2.  Good peripheral circulation. Respiratory: Normal respiratory effort without tachypnea or retractions. Lungs CTAB. Good air entry to the bases with no decreased or absent breath sounds Gastrointestinal: Bowel sounds x 4 quadrants. Soft and nontender to palpation. No guarding or rigidity. No distention. Musculoskeletal: No flexor or extensor tendon deficits appreciated with left index finger testing.  Patient does have pain and bruising along the dorsal aspect of the digit.  Palpable radial ulnar pulses bilaterally and symmetrically. Neurologic:  Normal for age. No gross focal neurologic deficits are appreciated.  Skin:  Skin is warm, dry and intact. No rash noted. Psychiatric: Mood and affect are normal for age. Speech and behavior are normal.   ____________________________________________   LABS (all labs ordered are listed, but only abnormal results are displayed)  Labs Reviewed - No data to display ____________________________________________  EKG   ____________________________________________  RADIOLOGY Geraldo Pitter, personally viewed and evaluated these images (plain radiographs) as part of my medical decision making, as well as reviewing the written report by the radiologist.  DG Finger Index Left  Result Date: 09/30/2021 CLINICAL DATA:  Crushing injury moving furniture EXAM: LEFT INDEX FINGER 2+V COMPARISON:  None. FINDINGS: No fracture or dislocation identified. No foreign body is observed. No  abnormal gas is identified in the soft tissues. IMPRESSION: 1.  No significant abnormality identified. Electronically Signed   By: Gaylyn Rong M.D.   On: 09/30/2021 14:54    ____________________________________________    PROCEDURES  Procedure(s) performed:     Procedures     Medications - No data to display   ____________________________________________   INITIAL IMPRESSION / ASSESSMENT AND PLAN / ED COURSE  Pertinent labs & imaging results that were available during my care of the patient were reviewed by me and considered in my medical decision making (see chart for details).      Assessment and plan Finger pain 35 year old female presents to the urgent care with left index finger  pain after contusion type injury.  No bony abnormality was visualized on x-ray.  No flexor or extensor tendon injuries were appreciated with testing.  Patient was prescribed meloxicam and a work note was given.  She was advised to follow-up with orthopedics as needed.      ____________________________________________  FINAL CLINICAL IMPRESSION(S) / ED DIAGNOSES  Final diagnoses:  Crushing injury of finger, initial encounter      NEW MEDICATIONS STARTED DURING THIS VISIT:  ED Discharge Orders          Ordered    meloxicam (MOBIC) 7.5 MG tablet  Daily        09/30/21 1511                This chart was dictated using voice recognition software/Dragon. Despite best efforts to proofread, errors can occur which can change the meaning. Any change was purely unintentional.     Orvil Feil, PA-C 09/30/21 1531

## 2021-09-30 NOTE — ED Triage Notes (Signed)
Patient c/o LFT pointer finger injury that happened today.   Patient endorses bruising.   Patient states " I was moving furniture when my finger got crushed in between the rale and the furniture".   Patient has used ice with no relief of symptoms

## 2021-09-30 NOTE — Discharge Instructions (Signed)
You can take two tablets of Meloxicam once daily for pain and inflammation.

## 2021-10-07 ENCOUNTER — Other Ambulatory Visit (HOSPITAL_COMMUNITY): Payer: Self-pay

## 2021-10-08 ENCOUNTER — Other Ambulatory Visit (HOSPITAL_COMMUNITY): Payer: Self-pay

## 2021-10-09 ENCOUNTER — Other Ambulatory Visit (HOSPITAL_COMMUNITY): Payer: Self-pay

## 2021-11-03 ENCOUNTER — Other Ambulatory Visit (HOSPITAL_COMMUNITY): Payer: Self-pay

## 2021-11-03 MED ORDER — OZEMPIC (2 MG/DOSE) 8 MG/3ML ~~LOC~~ SOPN
2.0000 mg | PEN_INJECTOR | SUBCUTANEOUS | 3 refills | Status: DC
  Filled 2021-11-03 – 2021-11-11 (×3): qty 3, 28d supply, fill #0

## 2021-11-11 ENCOUNTER — Other Ambulatory Visit (HOSPITAL_COMMUNITY): Payer: Self-pay

## 2021-12-17 ENCOUNTER — Other Ambulatory Visit (HOSPITAL_COMMUNITY): Payer: Self-pay

## 2022-01-13 ENCOUNTER — Other Ambulatory Visit (HOSPITAL_COMMUNITY): Payer: Self-pay

## 2022-01-13 ENCOUNTER — Ambulatory Visit (HOSPITAL_COMMUNITY)
Admission: EM | Admit: 2022-01-13 | Discharge: 2022-01-13 | Disposition: A | Discharge status: Home / Self Care | Payer: Managed Care, Other (non HMO) | Attending: Nurse Practitioner | Admitting: Nurse Practitioner

## 2022-01-13 ENCOUNTER — Encounter (HOSPITAL_COMMUNITY): Payer: Self-pay | Admitting: Emergency Medicine

## 2022-01-13 ENCOUNTER — Other Ambulatory Visit: Payer: Self-pay

## 2022-01-13 DIAGNOSIS — J020 Streptococcal pharyngitis: Secondary | ICD-10-CM | POA: Insufficient documentation

## 2022-01-13 DIAGNOSIS — R519 Headache, unspecified: Secondary | ICD-10-CM | POA: Insufficient documentation

## 2022-01-13 DIAGNOSIS — Z20822 Contact with and (suspected) exposure to covid-19: Secondary | ICD-10-CM | POA: Diagnosis not present

## 2022-01-13 LAB — SARS CORONAVIRUS 2 (TAT 6-24 HRS): SARS Coronavirus 2: NEGATIVE

## 2022-01-13 LAB — POCT RAPID STREP A, ED / UC: Streptococcus, Group A Screen (Direct): POSITIVE — AB

## 2022-01-13 MED ORDER — AMOXICILLIN 500 MG PO CAPS
500.0000 mg | ORAL_CAPSULE | Freq: Two times a day (BID) | ORAL | 0 refills | Status: AC
  Filled 2022-01-13: qty 20, 10d supply, fill #0

## 2022-01-13 MED ORDER — FLUCONAZOLE 150 MG PO TABS
150.0000 mg | ORAL_TABLET | Freq: Every day | ORAL | 1 refills | Status: DC
  Filled 2022-01-13: qty 1, 4d supply, fill #0
  Filled 2022-03-03: qty 1, 4d supply, fill #1

## 2022-01-13 NOTE — ED Triage Notes (Signed)
Pt reports sore throat, severe headache and chills since lastnight.  ?

## 2022-01-13 NOTE — Discharge Instructions (Addendum)
-   Please take the entire course of amoxicillin for strep throat; you can use diflucan after treatment is done ?- Please change your toothbrush after you complete treatment ?- We will let you know if the COVID-19 test comes back positive ?- Please isolate at home until you know the results ?- You can use Tylenol/ibuprofen for headache and throat lozenges or cepacol to help for throat pain ?- Please seek care if your symptoms do not improve despite treatment ?

## 2022-01-13 NOTE — ED Provider Notes (Signed)
?[facility] ? ? ? ?CSN: [id number] ?Arrival date & time: 01/13/22  0818 ? ? ?  ? ?History   ?Chief Complaint ?Chief Complaint  ?Patient presents with  ? Sore Throat  ? Headache  ? ? ?HPI ?Emily Miller is a 36 y.o. female.  ? ?Patient reports 1 day history of subjective fever, chills, severe sore throat, and headache.  She denies congestion, cough, runny nose, shortness of breath, chest pain, wheezing, nausea, vomiting, diarrhea.  She reports her daughter came home from school yesterday with similar symptoms.  She works as a Engineer, civil (consulting) in the NICU and is requesting strep throat testing and COVID testing today. ? ? ?Past Medical History:  ?Diagnosis Date  ? Anemia   ? Asthma   ? As a child. Not treated daily as an adult  ? Hemorrhoid   ? Insulin resistance   ? Joint pain   ? Multiple food allergies   ? Osteoarthritis   ? Spondylolisthesis   ? Vitamin D deficiency   ? ? ?Patient Active Problem List  ? Diagnosis Date Noted  ? Asthma 08/26/2014  ? Anemia 08/26/2014  ? ? ?Past Surgical History:  ?Procedure Laterality Date  ? TONSILECTOMY/ADENOIDECTOMY WITH MYRINGOTOMY    ? ? ?OB History   ? ? Gravida  ?4  ? Para  ?3  ? Term  ?3  ? Preterm  ?0  ? AB  ?1  ? Living  ?0  ?  ? ? SAB  ?1  ? IAB  ?0  ? Ectopic  ?0  ? Multiple  ?0  ? Live Births  ?0  ?   ?  ?  ? ? ? ?Home Medications   ? ?Prior to Admission medications   ?Medication Sig Start Date End Date Taking? Authorizing Provider  ?amoxicillin (AMOXIL) 500 MG capsule Take 1 capsule (500 mg total) by mouth 2 (two) times daily for 10 days. 01/13/22 01/23/22 Yes Valentino Nose, NP  ?fluconazole (DIFLUCAN) 150 MG tablet Take 1 tablet (150 mg total) by mouth daily. Repeat in 3 days if symptoms persist 01/13/22  Yes Cathlean Marseilles A, NP  ?ASHWAGANDHA PO Take 300 mg by mouth.    [provider]  ?benzonatate (TESSALON) 200 MG capsule Take 1 capsule (200 mg total) by mouth 3 (three) times daily as needed for cough. 09/02/21   Eustace Moore, MD   ?hydroxychloroquine (PLAQUENIL) 200 MG tablet Take 2 tablets (400 mg total) by mouth daily. 08/22/21     ?MELATONIN ER PO Take 6 mg by mouth.    [provider]  ?meloxicam (MOBIC) 7.5 MG tablet Take 1 tablet (7.5 mg total) by mouth daily. 09/30/21   Orvil Feil, PA-C  ?nabumetone (RELAFEN) 500 MG tablet Take 500 mg by mouth daily.    [provider]  ?Norgestimate-Ethinyl Estradiol Triphasic 0.18/0.215/0.25 MG-35 MCG tablet Take 1 tablet by mouth daily. 04/10/21 04/10/22  Bernerd Limbo, CNM  ?Semaglutide, 2 MG/DOSE, (OZEMPIC, 2 MG/DOSE,) 8 MG/3ML SOPN Inject 2 mg into the skin once a week. 11/03/21     ?Semaglutide-Weight Management (WEGOVY) 2.4 MG/0.75ML SOAJ Inject 2.4 mg as directed once a week. 08/27/21     ?VITAMIN D PO Take by mouth. With K2 125 mcg/152mcg    [provider]  ? ? ?Family History ?Family History  ?Problem Relation Age of Onset  ? Anxiety disorder Mother   ? Obesity Father   ? ? ?Social History ?Social History  ? ?Tobacco Use  ?  Smoking status: Never  ? Smokeless tobacco: Never  ?Vaping Use  ? Vaping Use: Never used  ?Substance Use Topics  ? Alcohol use: Yes  ?  Comment: soically  ? Drug use: Never  ? ? ? ?Allergies   ?Gluten meal ? ? ?Review of Systems ?Review of Systems ?Per HPI ? ?Physical Exam ?Triage Vital Signs ?ED Triage Vitals  ?Enc Vitals Group  ?   BP 01/13/22 0917 104/69  ?   Pulse Rate 01/13/22 0917 82  ?   Resp 01/13/22 0917 18  ?   Temp 01/13/22 0917 98.4 ?F (36.9 ?C)  ?   Temp Source 01/13/22 0917 Oral  ?   SpO2 01/13/22 0917 97 %  ?   Weight 01/13/22 0916 177 lb 4 oz (80.4 kg)  ?   Height 01/13/22 0916  (1.727 m)  ?   Head Circumference --   ?   Peak Flow --   ?   Pain Score 01/13/22 0916 4  ?   Pain Loc --   ?   Pain Edu? --   ?   Excl. in GC? --   ? ?No data found. ? ?Updated Vital Signs ?BP 104/69 (BP Location: Left Arm)   Pulse 82   Temp 98.4 ?F (36.9 ?C) (Oral)   Resp 18   Ht  (1.727 m)   Wt 177 lb 4 oz (80.4 kg)   SpO2 97%    BMI 26.95 kg/m?  ? ?Visual Acuity ?Right Eye Distance:   ?Left Eye Distance:   ?Bilateral Distance:   ? ?Right Eye Near:   ?Left Eye Near:    ?Bilateral Near:    ? ?Physical Exam ?Vitals and nursing note reviewed.  ?Constitutional:   ?   General: She is not in acute distress. ?   Appearance: She is well-developed. She is not ill-appearing or toxic-appearing.  ?HENT:  ?   Head: Normocephalic and atraumatic.  ?   Right Ear: Tympanic membrane and ear canal normal. No drainage, swelling or tenderness. No middle ear effusion. Tympanic membrane is not erythematous.  ?   Left Ear: Tympanic membrane and ear canal normal. No drainage, swelling or tenderness.  No middle ear effusion. Tympanic membrane is not erythematous.  ?   Nose: No congestion or rhinorrhea.  ?   Mouth/Throat:  ?   Pharynx: Posterior oropharyngeal erythema present. No pharyngeal swelling, oropharyngeal exudate or uvula swelling.  ?   Tonsils: No tonsillar exudate or tonsillar abscesses. 1+ on the right. 1+ on the left.  ?Cardiovascular:  ?   Rate and Rhythm: Normal rate and regular rhythm.  ?Pulmonary:  ?   Effort: Pulmonary effort is normal. No respiratory distress.  ?   Breath sounds: No wheezing, rhonchi or rales.  ?Lymphadenopathy:  ?   Cervical: No cervical adenopathy.  ?Skin: ?   General: Skin is warm and dry.  ?   Capillary Refill: Capillary refill takes less than 2 seconds.  ?   Coloration: Skin is not pale.  ?   Findings: No erythema or rash.  ?Neurological:  ?   Mental Status: She is alert and oriented to person, place, and time.  ? ? ? ?UC Treatments / Results  ?Labs ?(all labs ordered are listed, but only abnormal results are displayed) ?Labs Reviewed  ?POCT RAPID STREP A, ED / UC - Abnormal; Notable for the following components:  ?    Result Value  ? Streptococcus, Group A Screen (Direct) POSITIVE (*)   ?  All other components within normal limits  ?SARS CORONAVIRUS 2 (TAT 6-24 HRS)  ?POCT RAPID STREP A, ED / UC  ? ? ?EKG ? ? ?Radiology ?No  results found. ? ?Procedures ?Procedures (including critical care time) ? ?Medications Ordered in UC ?Medications - No data to display ? ?Initial Impression / Assessment and Plan / UC Course  ?I have reviewed the triage vital signs and the nursing notes. ? ?Pertinent labs & imaging results that were available during my care of the patient were reviewed by me and considered in my medical decision making (see chart for details). ? ?  ?Rapid strept test today is positive.  Treat with amoxicillin 500 mg twice daily for 10 days.  Change toothbrush after treatment.  Will also obtain COVID testing to rule out for employer.  Note given for work. ? ?Prescription given for diflucan as patient reports frequent yeast infections after antibiotic treatment. ?Final Clinical Impressions(s) / UC Diagnoses  ? ?Final diagnoses:  ?Acute nonintractable headache, unspecified headache type  ?Strep pharyngitis  ? ? ? ?Discharge Instructions   ? ?  ?- Please take the entire course of amoxicillin for strep throat; you can use diflucan after treatment is done ?- Please change your toothbrush after you complete treatment ?- We will let you know if the COVID-19 test comes back positive ?- Please isolate at home until you know the results ?- You can use Tylenol/ibuprofen for headache and throat lozenges or cepacol to help for throat pain ?- Please seek care if your symptoms do not improve despite treatment ? ? ? ? ?ED Prescriptions   ? ? Medication Sig Dispense Auth. Provider  ? amoxicillin (AMOXIL) 500 MG capsule Take 1 capsule (500 mg total) by mouth 2 (two) times daily for 10 days. 20 capsule Cathlean MarseillesMartinez, Deaun A, NP  ? fluconazole (DIFLUCAN) 150 MG tablet Take 1 tablet (150 mg total) by mouth daily. Repeat in 3 days if symptoms persist 1 tablet Valentino NoseMartinez, Emmerie A, NP  ? ?  ? ?PDMP not reviewed this encounter. ?  ?Valentino NoseMartinez, Jeannette A, NP ?01/13/22 [phone] ? ?

## 2022-02-11 ENCOUNTER — Other Ambulatory Visit (HOSPITAL_COMMUNITY)
Admission: RE | Admit: 2022-02-11 | Discharge: 2022-02-11 | Disposition: A | Discharge status: Home / Self Care | Payer: Commercial Managed Care - HMO | Source: Ambulatory Visit | Attending: Family Medicine | Admitting: Family Medicine

## 2022-02-11 DIAGNOSIS — N898 Other specified noninflammatory disorders of vagina: Secondary | ICD-10-CM | POA: Insufficient documentation

## 2022-03-03 ENCOUNTER — Other Ambulatory Visit: Payer: Self-pay

## 2022-03-03 ENCOUNTER — Other Ambulatory Visit (HOSPITAL_COMMUNITY): Payer: Self-pay

## 2022-03-04 ENCOUNTER — Other Ambulatory Visit (HOSPITAL_COMMUNITY): Payer: Self-pay

## 2022-03-10 ENCOUNTER — Other Ambulatory Visit: Payer: Self-pay

## 2022-03-10 ENCOUNTER — Other Ambulatory Visit (HOSPITAL_COMMUNITY): Payer: Self-pay

## 2022-03-11 ENCOUNTER — Ambulatory Visit (INDEPENDENT_AMBULATORY_CARE_PROVIDER_SITE_OTHER): Payer: Commercial Managed Care - HMO

## 2022-03-11 ENCOUNTER — Other Ambulatory Visit (HOSPITAL_COMMUNITY): Payer: Self-pay

## 2022-03-11 ENCOUNTER — Other Ambulatory Visit: Payer: Self-pay | Admitting: Certified Nurse Midwife

## 2022-03-11 VITALS — BP 110/73 | HR 79 | Wt 195.3 lb

## 2022-03-11 DIAGNOSIS — Z3041 Encounter for surveillance of contraceptive pills: Secondary | ICD-10-CM

## 2022-03-11 DIAGNOSIS — N898 Other specified noninflammatory disorders of vagina: Secondary | ICD-10-CM

## 2022-03-11 DIAGNOSIS — R8290 Unspecified abnormal findings in urine: Secondary | ICD-10-CM

## 2022-03-11 LAB — POCT URINALYSIS DIP (DEVICE)
Bilirubin Urine: NEGATIVE
Glucose, UA: NEGATIVE mg/dL
Ketones, ur: NEGATIVE mg/dL
Leukocytes,Ua: NEGATIVE
Nitrite: NEGATIVE
Protein, ur: NEGATIVE mg/dL
Specific Gravity, Urine: 1.015 (ref 1.005–1.030)
Urobilinogen, UA: 0.2 mg/dL (ref 0.0–1.0)
pH: 6.5 (ref 5.0–8.0)

## 2022-03-11 MED ORDER — NORGESTIM-ETH ESTRAD TRIPHASIC 0.18/0.215/0.25 MG-35 MCG PO TABS
1.0000 | ORAL_TABLET | Freq: Every day | ORAL | 3 refills | Status: DC
  Filled 2022-03-11: qty 84, 84d supply, fill #0
  Filled 2022-05-28: qty 84, 84d supply, fill #1

## 2022-03-11 NOTE — Progress Notes (Signed)
Pt here today for self swab.  Pt reports that she is having irritation, itchiness around the vaginal opening, and has an odor.  Pt states that she has tried to use refresh gels and has taken a diflucan tablet that she had left over and was effective but is still having irritation.  Pt also requests a UA just to rule out since she is having the burning.  Pt explained how to obtain the self swab and that we will call with abnormal results.  Pt advised to use monistat cream at her vaginal opening to see if that will give relief in the meantime to see if she needs any other treatment.   Pt verbalized understanding with no further questions.   Frances Nickels  03/11/22

## 2022-03-12 LAB — URINE CULTURE: Organism ID, Bacteria: NO GROWTH

## 2022-03-13 ENCOUNTER — Other Ambulatory Visit (HOSPITAL_COMMUNITY): Payer: Self-pay

## 2022-03-13 ENCOUNTER — Other Ambulatory Visit: Payer: Self-pay | Admitting: Certified Nurse Midwife

## 2022-03-13 DIAGNOSIS — B3731 Acute candidiasis of vulva and vagina: Secondary | ICD-10-CM

## 2022-03-13 DIAGNOSIS — N760 Acute vaginitis: Secondary | ICD-10-CM

## 2022-03-13 LAB — CERVICOVAGINAL ANCILLARY ONLY
Bacterial Vaginitis (gardnerella): POSITIVE — AB
Candida Glabrata: NEGATIVE
Candida Vaginitis: POSITIVE — AB
Chlamydia: NEGATIVE
Comment: NEGATIVE
Comment: NEGATIVE
Comment: NEGATIVE
Comment: NEGATIVE
Comment: NEGATIVE
Comment: NORMAL
Neisseria Gonorrhea: NEGATIVE
Trichomonas: NEGATIVE

## 2022-03-13 MED ORDER — METRONIDAZOLE 0.75 % VA GEL
1.0000 | Freq: Every day | VAGINAL | 0 refills | Status: AC
  Filled 2022-03-13: qty 70, 7d supply, fill #0

## 2022-03-13 MED ORDER — FLUCONAZOLE 150 MG PO TABS
150.0000 mg | ORAL_TABLET | Freq: Every day | ORAL | 3 refills | Status: DC
  Filled 2022-03-13: qty 2, 3d supply, fill #0
  Filled 2022-03-26: qty 2, 3d supply, fill #1
  Filled 2022-04-25: qty 2, 3d supply, fill #0

## 2022-03-13 NOTE — Progress Notes (Signed)
Results from swab back showing yeast and bacterial vaginosis. Scripts sent in, pt aware. Gaylan Gerold, CNM, MSN, [place] Certified Nurse Midwife, [facility]

## 2022-03-17 ENCOUNTER — Other Ambulatory Visit (HOSPITAL_COMMUNITY): Payer: Self-pay

## 2022-03-17 MED ORDER — SODIUM FLUORIDE 5000 SENSITIVE 1.1-5 % DT GEL
DENTAL | 2 refills | Status: DC
  Filled 2022-03-17: qty 100, 30d supply, fill #0

## 2022-03-17 MED ORDER — METFORMIN HCL ER 500 MG PO TB24
500.0000 mg | ORAL_TABLET | Freq: Every evening | ORAL | 1 refills | Status: DC
  Filled 2022-03-17: qty 90, 90d supply, fill #0

## 2022-03-18 ENCOUNTER — Other Ambulatory Visit (HOSPITAL_COMMUNITY): Payer: Self-pay

## 2022-03-23 ENCOUNTER — Other Ambulatory Visit: Payer: Self-pay | Admitting: Certified Nurse Midwife

## 2022-03-23 ENCOUNTER — Other Ambulatory Visit (HOSPITAL_COMMUNITY): Payer: Self-pay

## 2022-03-23 DIAGNOSIS — S3992XS Unspecified injury of lower back, sequela: Secondary | ICD-10-CM

## 2022-03-23 MED ORDER — IBUPROFEN 600 MG PO TABS
600.0000 mg | ORAL_TABLET | Freq: Four times a day (QID) | ORAL | 1 refills | Status: DC | PRN
  Filled 2022-03-23: qty 30, 8d supply, fill #0
  Filled 2022-04-08: qty 30, 8d supply, fill #1

## 2022-03-23 MED ORDER — PREDNISONE 5 MG PO TABS
5.0000 mg | ORAL_TABLET | Freq: Every day | ORAL | 0 refills | Status: DC
  Filled 2022-03-23: qty 3, 3d supply, fill #0

## 2022-03-27 ENCOUNTER — Other Ambulatory Visit (HOSPITAL_COMMUNITY): Payer: Self-pay

## 2022-04-08 ENCOUNTER — Other Ambulatory Visit (HOSPITAL_COMMUNITY): Payer: Self-pay

## 2022-04-08 MED ORDER — HYDROXYCHLOROQUINE SULFATE 200 MG PO TABS
400.0000 mg | ORAL_TABLET | Freq: Every day | ORAL | 1 refills | Status: DC
  Filled 2022-04-08: qty 180, 90d supply, fill #0
  Filled 2022-07-24: qty 180, 90d supply, fill #1

## 2022-04-25 ENCOUNTER — Other Ambulatory Visit (HOSPITAL_COMMUNITY): Payer: Self-pay

## 2022-05-20 ENCOUNTER — Encounter (INDEPENDENT_AMBULATORY_CARE_PROVIDER_SITE_OTHER): Payer: Self-pay

## 2022-05-28 ENCOUNTER — Other Ambulatory Visit (HOSPITAL_COMMUNITY): Payer: Self-pay

## 2022-06-03 ENCOUNTER — Ambulatory Visit (INDEPENDENT_AMBULATORY_CARE_PROVIDER_SITE_OTHER): Payer: Commercial Managed Care - HMO | Admitting: Certified Nurse Midwife

## 2022-06-03 ENCOUNTER — Encounter: Payer: Self-pay | Admitting: Certified Nurse Midwife

## 2022-06-03 VITALS — BP 117/79 | HR 79 | Ht 68.0 in | Wt 202.6 lb

## 2022-06-03 DIAGNOSIS — Z3041 Encounter for surveillance of contraceptive pills: Secondary | ICD-10-CM

## 2022-06-03 DIAGNOSIS — Z01419 Encounter for gynecological examination (general) (routine) without abnormal findings: Secondary | ICD-10-CM

## 2022-06-03 DIAGNOSIS — E0590 Thyrotoxicosis, unspecified without thyrotoxic crisis or storm: Secondary | ICD-10-CM | POA: Insufficient documentation

## 2022-06-03 DIAGNOSIS — N760 Acute vaginitis: Secondary | ICD-10-CM

## 2022-06-03 DIAGNOSIS — E88819 Insulin resistance, unspecified: Secondary | ICD-10-CM | POA: Insufficient documentation

## 2022-06-03 DIAGNOSIS — E8881 Metabolic syndrome: Secondary | ICD-10-CM | POA: Insufficient documentation

## 2022-06-05 MED ORDER — NORGESTIM-ETH ESTRAD TRIPHASIC 0.18/0.215/0.25 MG-35 MCG PO TABS
1.0000 | ORAL_TABLET | Freq: Every day | ORAL | 3 refills | Status: DC
  Filled 2022-06-05 – 2022-07-24 (×2): qty 84, 84d supply, fill #0
  Filled 2022-07-31 – 2022-08-03 (×2): qty 28, 28d supply, fill #0
  Filled 2022-09-22: qty 84, 84d supply, fill #1
  Filled 2022-12-19: qty 84, 84d supply, fill #2

## 2022-06-05 NOTE — Progress Notes (Signed)
ANNUAL EXAM Patient name: Emily Miller MRN [id number]  Date of birth: 1986-07-25 Chief Complaint:   Gynecologic Exam  History of Present Illness:   Emily Miller is a 36 y.o. G52P3010 Caucasian female being seen today for a routine annual exam.  Current complaints: None  No LMP recorded. (Menstrual status: Oral contraceptives).  Last pap 02/20/21. Results were: NILM w/ HRHPV negative. H/O abnormal pap: no Last mammogram: Never (age). Results were: N/A. Family h/o breast cancer: no Last colonoscopy: Never (age). Results were: N/A. Family h/o colorectal cancer: no     03/24/2021    3:51 PM 10/16/2020    9:27 AM 07/24/2020   11:01 AM  Depression screen PHQ 2/9  Decreased Interest 0 0 0  Down, Depressed, Hopeless 0 0 0  PHQ - 2 Score 0 0 0  Altered sleeping 0 1 0  Tired, decreased energy 0 1 1  Change in appetite 0 1 0  Feeling bad or failure about yourself  0 0 0  Trouble concentrating 0 0 0  Moving slowly or fidgety/restless 0 0 0  Suicidal thoughts 0 0 0  PHQ-9 Score 0 3 1  Difficult doing work/chores  Not difficult at all Not difficult at all        03/24/2021    3:51 PM 07/24/2020   11:01 AM  GAD 7 : Generalized Anxiety Score  Nervous, Anxious, on Edge 0 0  Control/stop worrying 0 0  Worry too much - different things 0 0  Trouble relaxing 0 0  Restless 0 0  Easily annoyed or irritable 0 0  Afraid - awful might happen 0 0  Total GAD 7 Score 0 0  Anxiety Difficulty  Not difficult at all     Review of Systems:   Pertinent items are noted in HPI Denies any headaches, blurred vision, fatigue, shortness of breath, chest pain, abdominal pain, abnormal vaginal discharge/itching/odor/irritation, problems with periods, bowel movements, urination, or intercourse unless otherwise stated above. Pertinent History Reviewed:  Reviewed past medical,surgical, social and family history.  Reviewed problem list, medications and allergies. Physical Assessment:   Vitals:    06/03/22 1446 06/03/22 1501  BP: (!) 146/75 117/79  Pulse: 93 79  Weight: 202 lb 9.6 oz (91.9 kg)   Height: 5\' 8"  (1.727 m)    Body mass index is 30.81 kg/m.   Physical Examination:  General appearance - well appearing, and in no distress Mental status - alert, oriented to person, place, and time Psych:  She has a normal mood and affect Skin - warm and dry, normal color, no suspicious lesions noted Chest - effort normal, no problems with respiration noted Heart - normal rate and regular rhythm, warm and well perfused Neck:  midline trachea, no thyromegaly or nodules Breasts - breasts appear normal Abdomen - soft, nontender, nondistended Pelvic - exam deferred Extremities:  No swelling or varicosities noted  No results found for this or any previous visit (from the past 24 hour(s)).  Assessment & Plan:  1. Encounter for annual routine gynecological examination - Will follow up results of pap smear and manage accordingly. - Routine preventative health maintenance measures emphasized.  2. Recurrent vaginitis - Managing well with monthly boric acid dose  3. Surveillance for birth control, oral contraceptives - Happy with current method, wants to continue - Norgestimate-Ethinyl Estradiol Triphasic 0.18/0.215/0.25 MG-35 MCG tablet; Take 1 tablet by mouth daily.  Dispense: 84 tablet; Refill: 3  Mammogram: @ 36yo, or sooner if problems Colonoscopy: @ 36yo, or  sooner if problems  Meds:  Meds ordered this encounter  Medications   Norgestimate-Ethinyl Estradiol Triphasic 0.18/0.215/0.25 MG-35 MCG tablet    Sig: Take 1 tablet by mouth daily.    Dispense:  84 tablet    Refill:  3    Give 90-day supply    Order Specific Question:   Supervising Provider    Answer:   Reva Bores [[id number]]    Follow-up: Return in about 1 year (around 06/04/2023) for ANN.  Bernerd Limbo, CNM 06/05/2022 8:13 PM

## 2022-06-08 ENCOUNTER — Other Ambulatory Visit (HOSPITAL_COMMUNITY): Payer: Self-pay

## 2022-06-12 IMAGING — DX DG CHEST 2V
2 series · 2 of 2 positions shown · non-contrast
Comparison: None.

CLINICAL DATA: 34-year-old female with fatigue.

EXAM:
CHEST - 2 VIEW

[chest pa]
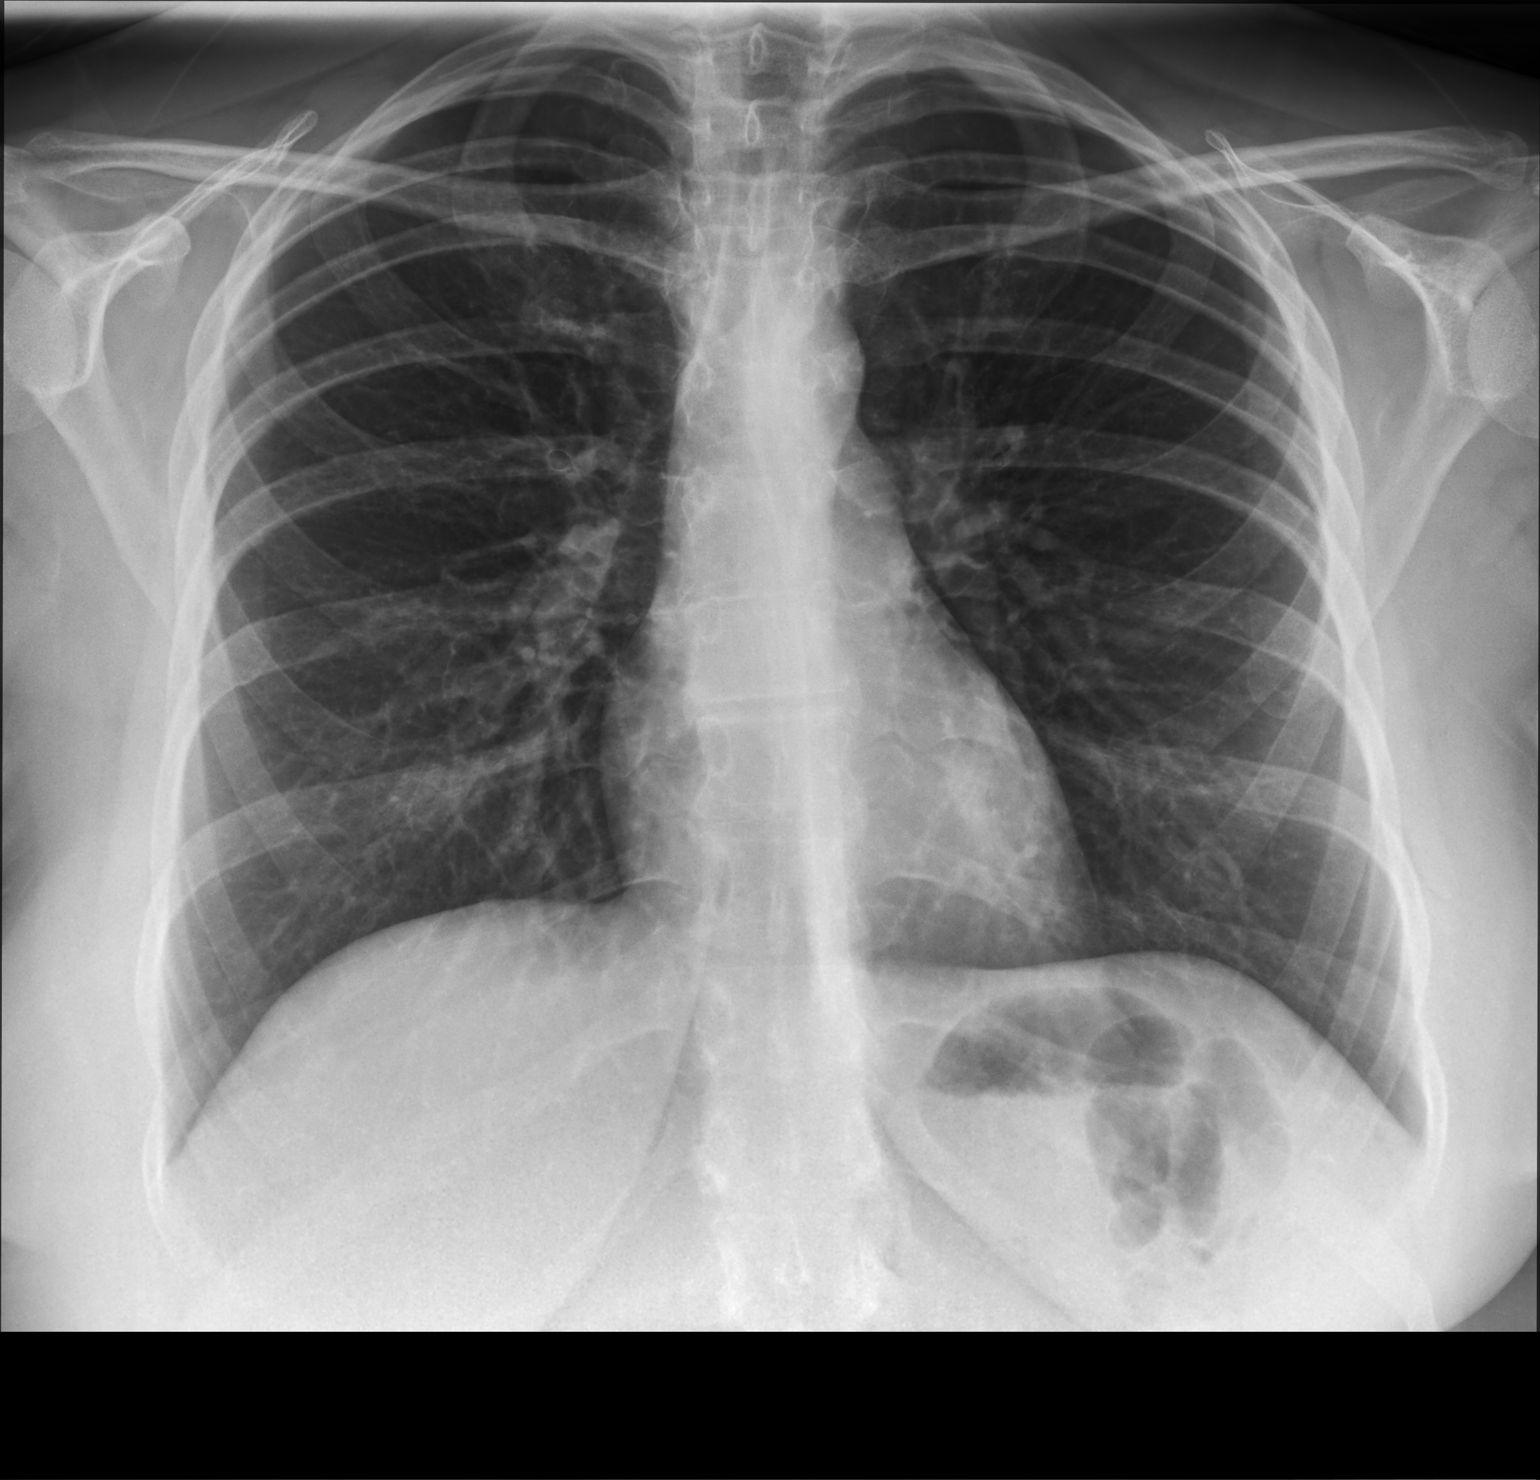

[chest lat]
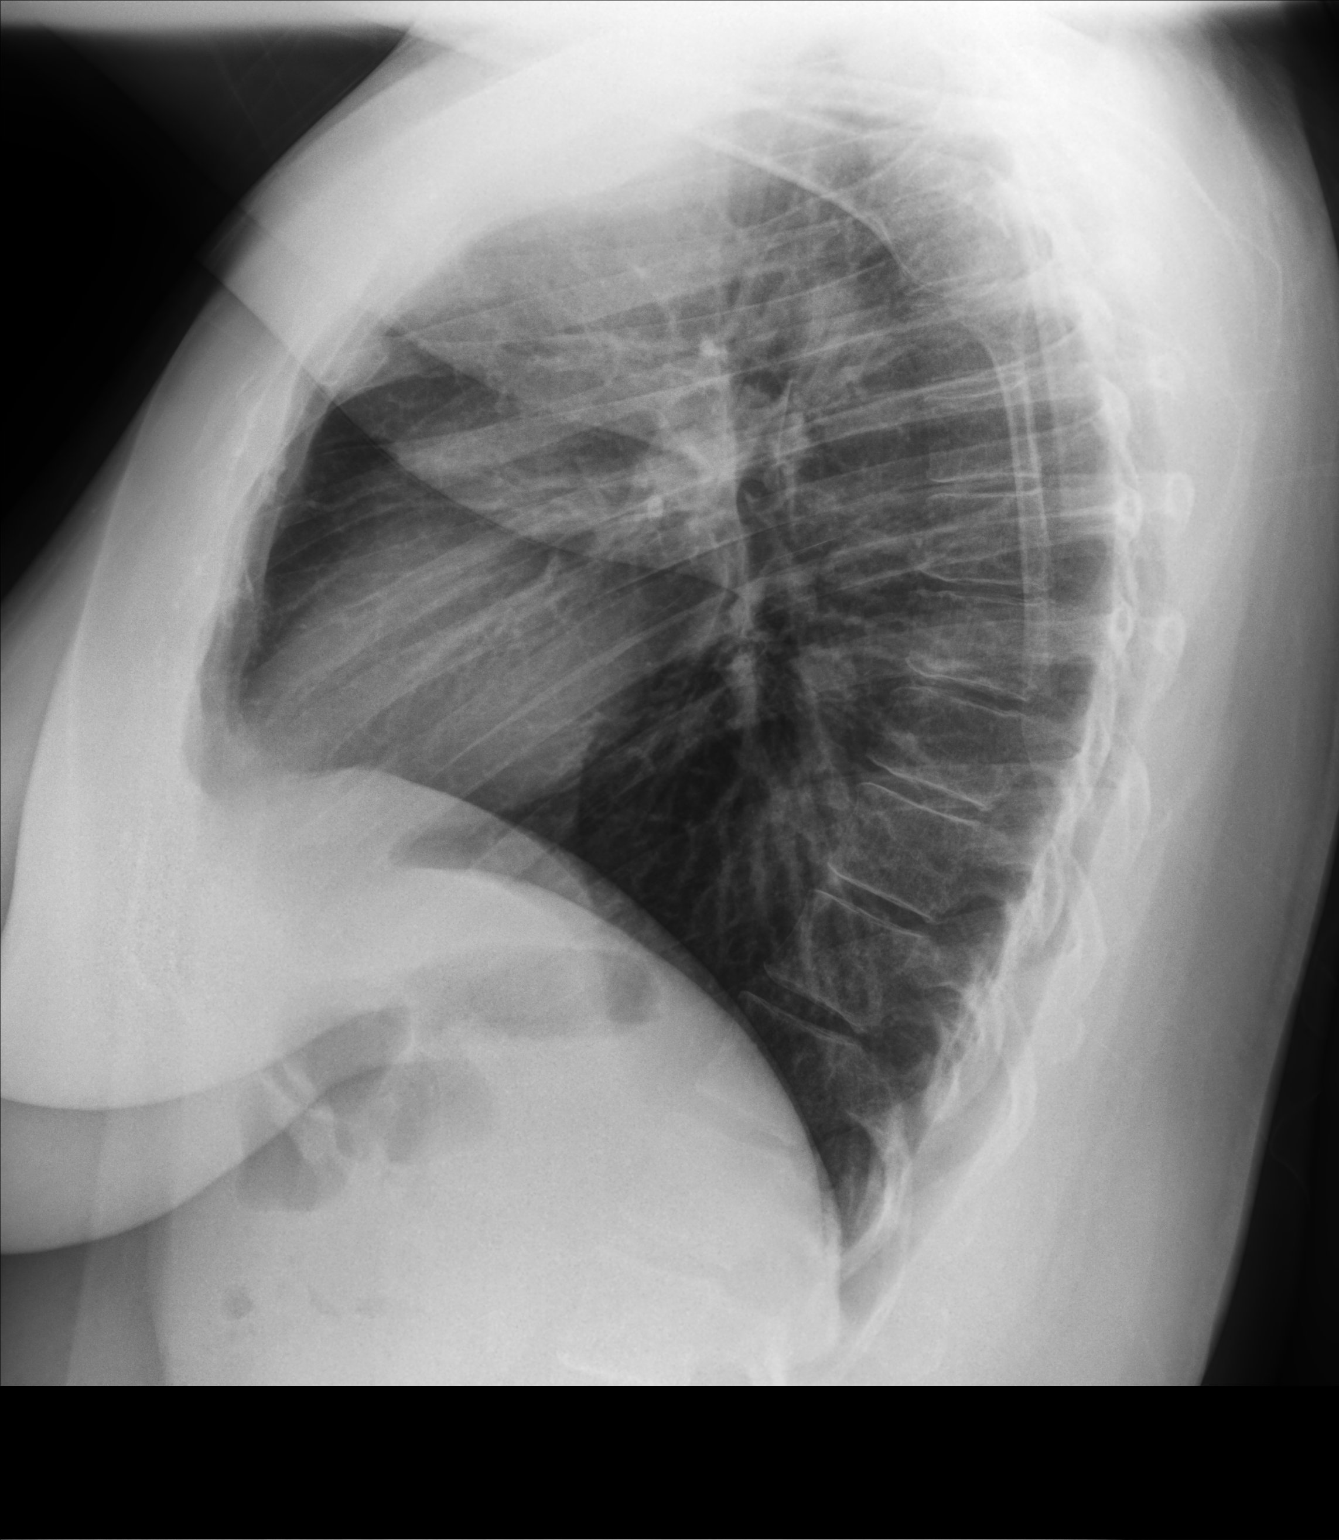

[2 of 2 positions shown; findings below may reference images not displayed]

FINDINGS: The heart size and mediastinal contours are within normal limits.
Both lungs are clear. The visualized skeletal structures are
unremarkable.
IMPRESSION: No active cardiopulmonary disease.

## 2022-06-26 ENCOUNTER — Other Ambulatory Visit (HOSPITAL_COMMUNITY): Payer: Self-pay

## 2022-06-29 ENCOUNTER — Other Ambulatory Visit (HOSPITAL_COMMUNITY): Payer: Self-pay

## 2022-06-29 MED ORDER — OZEMPIC (0.25 OR 0.5 MG/DOSE) 2 MG/3ML ~~LOC~~ SOPN
0.5000 mg | PEN_INJECTOR | SUBCUTANEOUS | 1 refills | Status: DC
  Filled 2022-06-29 (×2): qty 3, 28d supply, fill #0

## 2022-06-30 ENCOUNTER — Other Ambulatory Visit (HOSPITAL_COMMUNITY): Payer: Self-pay

## 2022-06-30 MED ORDER — MOUNJARO 2.5 MG/0.5ML ~~LOC~~ SOAJ
2.5000 mg | SUBCUTANEOUS | 0 refills | Status: DC
  Filled 2022-06-30: qty 2, 28d supply, fill #0

## 2022-07-21 ENCOUNTER — Other Ambulatory Visit (HOSPITAL_COMMUNITY): Payer: Self-pay

## 2022-07-21 MED ORDER — MOUNJARO 5 MG/0.5ML ~~LOC~~ SOAJ
5.0000 mg | SUBCUTANEOUS | 0 refills | Status: DC
  Filled 2022-07-21 – 2022-07-24 (×2): qty 2, 28d supply, fill #0

## 2022-07-24 ENCOUNTER — Other Ambulatory Visit (HOSPITAL_COMMUNITY): Payer: Self-pay

## 2022-07-31 ENCOUNTER — Other Ambulatory Visit (HOSPITAL_COMMUNITY): Payer: Self-pay

## 2022-08-03 ENCOUNTER — Other Ambulatory Visit (HOSPITAL_COMMUNITY): Payer: Self-pay

## 2022-08-20 ENCOUNTER — Other Ambulatory Visit (HOSPITAL_COMMUNITY): Payer: Self-pay

## 2022-08-20 MED ORDER — MOUNJARO 7.5 MG/0.5ML ~~LOC~~ SOAJ
7.5000 mg | SUBCUTANEOUS | 0 refills | Status: DC
  Filled 2022-08-20: qty 2, 28d supply, fill #0

## 2022-08-22 ENCOUNTER — Other Ambulatory Visit (HOSPITAL_COMMUNITY): Payer: Self-pay

## 2022-08-22 ENCOUNTER — Ambulatory Visit (HOSPITAL_COMMUNITY)
Admission: EM | Admit: 2022-08-22 | Discharge: 2022-08-22 | Disposition: A | Discharge status: Home / Self Care | Payer: Commercial Managed Care - HMO

## 2022-08-22 MED ORDER — FLUCONAZOLE 150 MG PO TABS
150.0000 mg | ORAL_TABLET | ORAL | 0 refills | Status: DC | PRN
  Filled 2022-08-22: qty 1, 1d supply, fill #0

## 2022-08-22 MED ORDER — CEPHALEXIN 500 MG PO CAPS
500.0000 mg | ORAL_CAPSULE | Freq: Three times a day (TID) | ORAL | 0 refills | Status: DC
  Filled 2022-08-22 (×2): qty 21, 7d supply, fill #0

## 2022-08-22 MED ORDER — ERYTHROMYCIN 5 MG/GM OP OINT
TOPICAL_OINTMENT | OPHTHALMIC | 0 refills | Status: DC
  Filled 2022-08-22: qty 3.5, 7d supply, fill #0

## 2022-09-22 ENCOUNTER — Other Ambulatory Visit (HOSPITAL_COMMUNITY): Payer: Self-pay

## 2022-09-22 MED ORDER — MOUNJARO 10 MG/0.5ML ~~LOC~~ SOAJ
10.0000 mg | SUBCUTANEOUS | 0 refills | Status: DC
  Filled 2022-09-22: qty 2, 28d supply, fill #0

## 2022-09-23 ENCOUNTER — Other Ambulatory Visit (HOSPITAL_COMMUNITY): Payer: Self-pay

## 2022-09-30 ENCOUNTER — Other Ambulatory Visit (HOSPITAL_COMMUNITY): Payer: Self-pay

## 2022-09-30 MED ORDER — PAXLOVID (300/100) 20 X 150 MG & 10 X 100MG PO TBPK
ORAL_TABLET | ORAL | 0 refills | Status: DC
  Filled 2022-09-30: qty 30, 5d supply, fill #0

## 2022-09-30 MED ORDER — ONDANSETRON 4 MG PO TBDP
4.0000 mg | ORAL_TABLET | Freq: Three times a day (TID) | ORAL | 0 refills | Status: DC | PRN
  Filled 2022-09-30: qty 10, 4d supply, fill #0

## 2022-10-07 ENCOUNTER — Other Ambulatory Visit (HOSPITAL_COMMUNITY): Payer: Self-pay

## 2022-10-07 MED ORDER — FLUTICASONE PROPIONATE 50 MCG/ACT NA SUSP
1.0000 | Freq: Every day | NASAL | 3 refills | Status: DC
  Filled 2022-10-07: qty 16, 60d supply, fill #0

## 2022-10-07 MED ORDER — AMOXICILLIN 500 MG PO CAPS
500.0000 mg | ORAL_CAPSULE | Freq: Two times a day (BID) | ORAL | 0 refills | Status: DC
  Filled 2022-10-07: qty 14, 7d supply, fill #0

## 2022-10-07 MED ORDER — FLUCONAZOLE 200 MG PO TABS
200.0000 mg | ORAL_TABLET | ORAL | 0 refills | Status: DC
  Filled 2022-10-07: qty 2, 7d supply, fill #0

## 2022-10-21 ENCOUNTER — Other Ambulatory Visit (HOSPITAL_COMMUNITY): Payer: Self-pay

## 2022-10-21 MED ORDER — MOUNJARO 12.5 MG/0.5ML ~~LOC~~ SOAJ
12.5000 mg | SUBCUTANEOUS | 0 refills | Status: DC
  Filled 2022-10-21 (×2): qty 2, 28d supply, fill #0

## 2022-11-03 ENCOUNTER — Other Ambulatory Visit (HOSPITAL_COMMUNITY): Payer: Self-pay

## 2022-11-06 ENCOUNTER — Other Ambulatory Visit (HOSPITAL_COMMUNITY): Payer: Self-pay

## 2022-11-06 MED ORDER — HYDROXYCHLOROQUINE SULFATE 200 MG PO TABS
400.0000 mg | ORAL_TABLET | Freq: Every day | ORAL | 0 refills | Status: DC
  Filled 2022-11-06 – 2022-11-07 (×2): qty 60, 30d supply, fill #0

## 2022-11-07 ENCOUNTER — Other Ambulatory Visit (HOSPITAL_COMMUNITY): Payer: Self-pay

## 2022-11-09 ENCOUNTER — Other Ambulatory Visit (HOSPITAL_COMMUNITY): Payer: Self-pay

## 2022-11-18 ENCOUNTER — Other Ambulatory Visit (HOSPITAL_COMMUNITY): Payer: Self-pay

## 2022-11-18 MED ORDER — MOUNJARO 15 MG/0.5ML ~~LOC~~ SOAJ
15.0000 mg | SUBCUTANEOUS | 0 refills | Status: DC
  Filled 2022-11-18 – 2022-11-20 (×3): qty 2, 28d supply, fill #0

## 2022-11-19 ENCOUNTER — Other Ambulatory Visit (HOSPITAL_COMMUNITY): Payer: Self-pay

## 2022-11-20 ENCOUNTER — Other Ambulatory Visit (HOSPITAL_COMMUNITY): Payer: Self-pay

## 2022-12-09 ENCOUNTER — Other Ambulatory Visit (HOSPITAL_COMMUNITY): Payer: Self-pay

## 2022-12-09 MED ORDER — HYDROXYCHLOROQUINE SULFATE 200 MG PO TABS
400.0000 mg | ORAL_TABLET | Freq: Every day | ORAL | 2 refills | Status: DC
  Filled 2022-12-09: qty 60, 30d supply, fill #0

## 2022-12-19 ENCOUNTER — Other Ambulatory Visit (HOSPITAL_COMMUNITY): Payer: Self-pay

## 2022-12-21 ENCOUNTER — Other Ambulatory Visit (HOSPITAL_COMMUNITY): Payer: Self-pay

## 2022-12-22 ENCOUNTER — Other Ambulatory Visit: Payer: Self-pay

## 2022-12-22 ENCOUNTER — Other Ambulatory Visit (HOSPITAL_COMMUNITY): Payer: Self-pay

## 2022-12-22 MED ORDER — MOUNJARO 15 MG/0.5ML ~~LOC~~ SOAJ
15.0000 mg | SUBCUTANEOUS | 0 refills | Status: DC
  Filled 2022-12-22 – 2022-12-28 (×3): qty 2, 28d supply, fill #0

## 2022-12-23 ENCOUNTER — Other Ambulatory Visit (HOSPITAL_COMMUNITY): Payer: Self-pay

## 2022-12-28 ENCOUNTER — Other Ambulatory Visit (HOSPITAL_COMMUNITY): Payer: Self-pay

## 2022-12-28 ENCOUNTER — Other Ambulatory Visit: Payer: Self-pay

## 2023-01-04 ENCOUNTER — Other Ambulatory Visit (HOSPITAL_COMMUNITY): Payer: Self-pay

## 2023-01-04 MED ORDER — HYDROXYCHLOROQUINE SULFATE 200 MG PO TABS
400.0000 mg | ORAL_TABLET | Freq: Every day | ORAL | 1 refills | Status: DC
  Filled 2023-01-04: qty 180, 90d supply, fill #0

## 2023-01-15 ENCOUNTER — Other Ambulatory Visit (HOSPITAL_COMMUNITY): Payer: Self-pay

## 2023-03-09 ENCOUNTER — Other Ambulatory Visit (HOSPITAL_COMMUNITY): Payer: Self-pay

## 2023-03-24 ENCOUNTER — Other Ambulatory Visit: Payer: Self-pay | Admitting: Certified Nurse Midwife

## 2023-03-24 ENCOUNTER — Other Ambulatory Visit (HOSPITAL_COMMUNITY): Payer: Self-pay

## 2023-03-24 DIAGNOSIS — B3731 Acute candidiasis of vulva and vagina: Secondary | ICD-10-CM

## 2023-03-24 MED ORDER — FLUCONAZOLE 150 MG PO TABS
150.0000 mg | ORAL_TABLET | Freq: Once | ORAL | 1 refills | Status: AC
Start: 2023-03-24 — End: 2023-03-28
  Filled 2023-03-24: qty 1, 1d supply, fill #0
  Filled 2023-03-27: qty 1, 1d supply, fill #1

## 2023-03-24 NOTE — Progress Notes (Signed)
Pt called in to report symptoms consistent with vaginal yeast infection, requests diflucan.  Edd Arbour, CNM, MSN, IBCLC Certified Nurse Midwife, [facility]

## 2023-03-27 ENCOUNTER — Other Ambulatory Visit (HOSPITAL_COMMUNITY): Payer: Self-pay

## 2023-04-23 ENCOUNTER — Other Ambulatory Visit (HOSPITAL_COMMUNITY): Payer: Self-pay

## 2023-04-23 MED ORDER — PERMETHRIN 5 % EX CREA
TOPICAL_CREAM | CUTANEOUS | 1 refills | Status: DC
  Filled 2023-04-23: qty 60, 1d supply, fill #0

## 2023-04-24 ENCOUNTER — Other Ambulatory Visit (HOSPITAL_COMMUNITY): Payer: Self-pay

## 2023-06-06 IMAGING — DX DG FINGER INDEX 2+V*L*
3 series · 3 of 3 positions shown · non-contrast
Comparison: None.

CLINICAL DATA: Crushing injury moving furniture

EXAM:
LEFT INDEX FINGER 2+V

[finger ap]
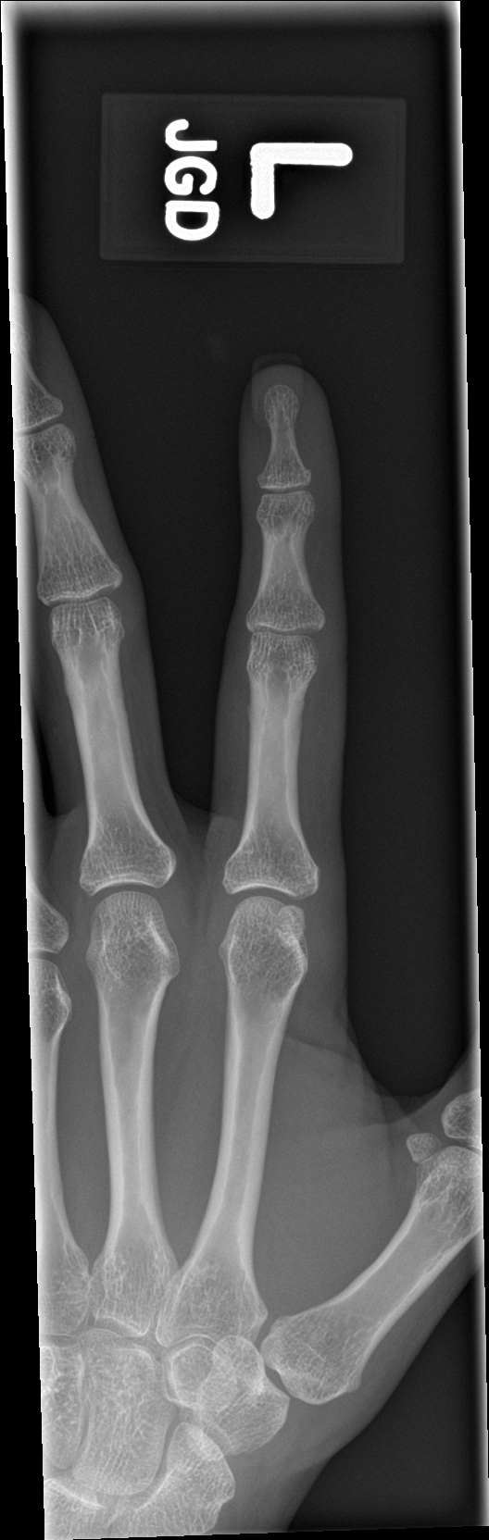

[finger obl]
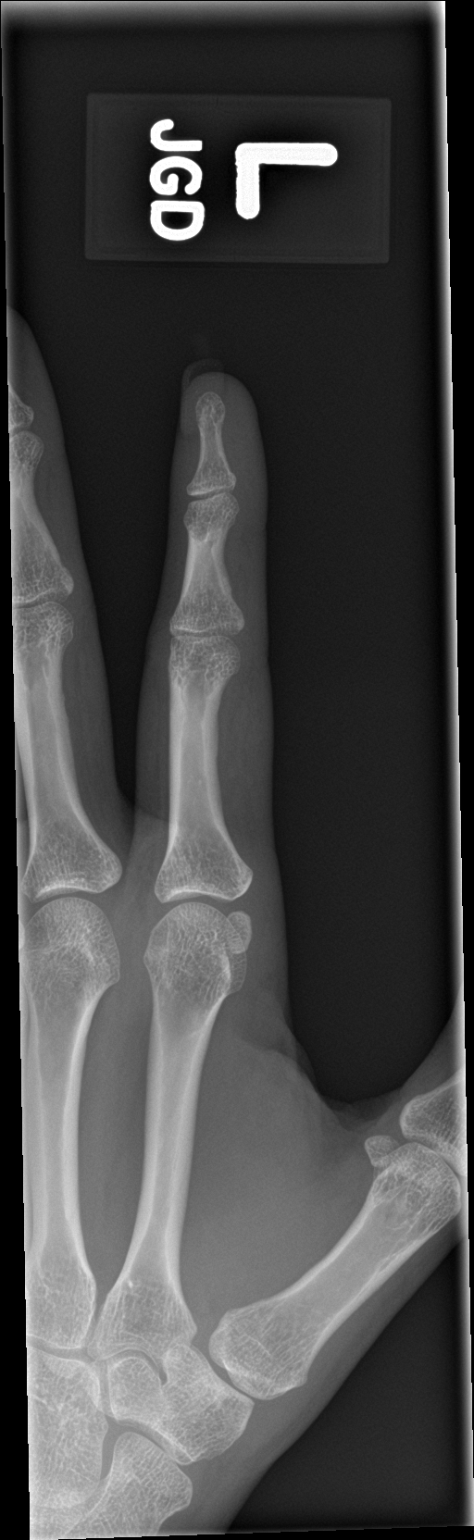

[finger lat]
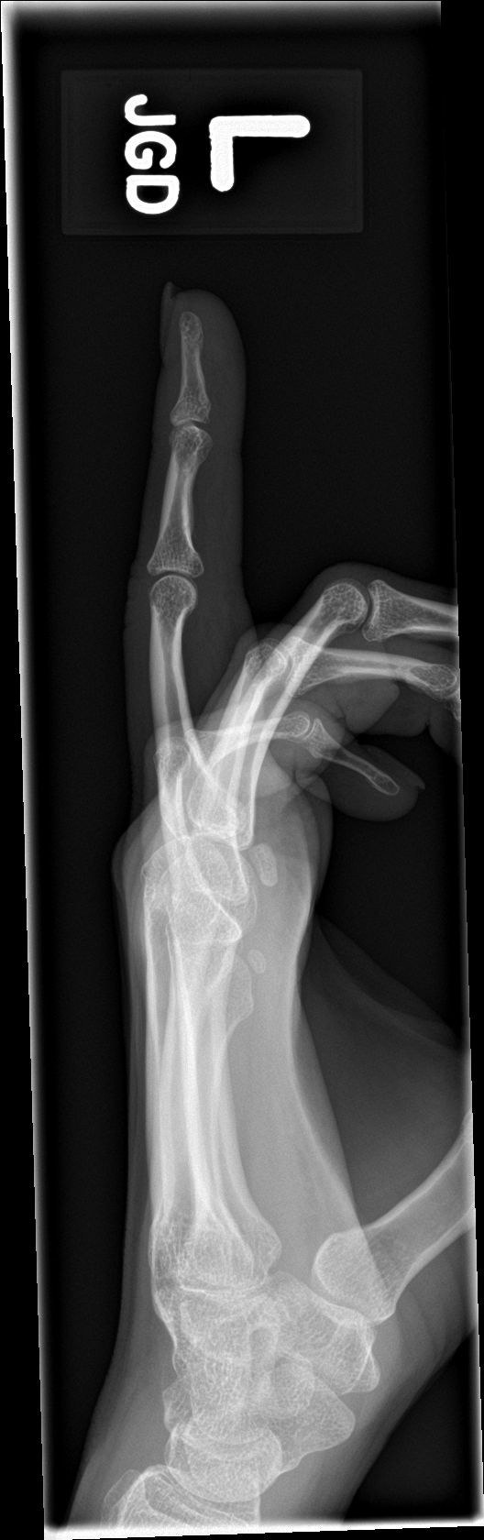

[3 of 3 positions shown; findings below may reference images not displayed]

FINDINGS: No fracture or dislocation identified. No foreign body is observed.
No abnormal gas is identified in the soft tissues.
IMPRESSION: 1.  No significant abnormality identified.

## 2023-06-29 ENCOUNTER — Other Ambulatory Visit (HOSPITAL_COMMUNITY): Payer: Self-pay

## 2023-06-29 MED ORDER — TRETINOIN 0.025 % EX CREA
1.0000 | TOPICAL_CREAM | Freq: Every evening | CUTANEOUS | 2 refills | Status: DC
  Filled 2023-06-29: qty 45, 30d supply, fill #0

## 2023-07-22 ENCOUNTER — Other Ambulatory Visit: Payer: Self-pay | Admitting: Certified Nurse Midwife

## 2023-07-22 DIAGNOSIS — Z3041 Encounter for surveillance of contraceptive pills: Secondary | ICD-10-CM

## 2023-09-22 ENCOUNTER — Ambulatory Visit: Payer: Commercial Managed Care - HMO | Admitting: Obstetrics and Gynecology

## 2023-11-10 ENCOUNTER — Encounter: Payer: Self-pay | Admitting: Obstetrics and Gynecology

## 2023-11-10 ENCOUNTER — Other Ambulatory Visit: Payer: Self-pay

## 2023-11-10 ENCOUNTER — Ambulatory Visit (INDEPENDENT_AMBULATORY_CARE_PROVIDER_SITE_OTHER): Payer: Self-pay | Admitting: Obstetrics and Gynecology

## 2023-11-10 VITALS — BP 103/64 | HR 77 | Ht 68.5 in | Wt 199.0 lb

## 2023-11-10 DIAGNOSIS — Z3169 Encounter for other general counseling and advice on procreation: Secondary | ICD-10-CM

## 2023-11-10 NOTE — Progress Notes (Signed)
   GYNECOLOGY OFFICE VISIT NOTE  History:   Emily Miller is a 38 y.o. [phone] here today for preconception counseling and to establish care. She is undergoing evaluation for surrogacy and goes to the IVF clinic in [place] 2/11. If approved, she will be a surrogate for a woman who has had 3 prior c-sections. Bonna herself has had 3 uncomplicated SVDs. She is on OCPs until that time.   She is a Nurse, learning disability.   She denies any abnormal vaginal discharge, bleeding, pelvic pain or other concerns.  The following portions of the patient's history were reviewed and updated as appropriate: allergies, current medications, past family history, past medical history, past social history, past surgical history and problem list.   Health Maintenance:   07/2023: Normal pap/hpv with Dr. Shawnie Pons. Seen through [facility].   Review of Systems:  Pertinent items noted in HPI and remainder of comprehensive ROS otherwise negative.  Physical Exam:  BP 103/64   Pulse 77   Ht 5' 8.5" (1.74 m)   Wt 199 lb (90.3 kg)   BMI 29.81 kg/m  CONSTITUTIONAL: Well-developed, well-nourished female in no acute distress.  HEENT:  Normocephalic, atraumatic. External right and left ear normal. No scleral icterus.  NECK: Normal range of motion, supple, no masses noted on observation SKIN: No rash noted. Not diaphoretic. No erythema. No pallor. MUSCULOSKELETAL: Normal range of motion. No edema noted. NEUROLOGIC: Alert and oriented to person, place, and time. Normal muscle tone coordination. No cranial nerve deficit noted. PSYCHIATRIC: Normal mood and affect. Normal behavior. Normal judgment and thought content.   PELVIC: Deferred  Labs and Imaging No results found for this or any previous visit (from the past week). No results found.  Assessment and Plan:   1. Encounter for preconception consultation (Primary) - Current birth control:  OCPs - She should take folic acid 400 mcg via MVI, PNV, etc.  - Discussed diet and exercise  before pregnancy in order to be at healthy pre-pregnancy weight. Reviewed this decreases complications in pregnancy and can help with weight gain during pregnancy which is also important for her health and the health of the pregnancy.  - Medical problems:  Inflammatory arthritis - Concerning social habits: None  - Medications reviewed:  Plaquenil is acceptable during pregnancy.  - She would hope for waterbirth. Reviewed no contraindications at this time but given information on it as well in AVS   Routine preventative health maintenance measures emphasized. Please refer to After Visit Summary for other counseling recommendations.   Return in about 1 year (around 11/09/2024) for annual.  Milas Hock, MD, FACOG Obstetrician & Gynecologist, [facility] for [employer], [facility]

## 2023-11-10 NOTE — Patient Instructions (Signed)
  Considering Waterbirth? Guide for patients at [facility] [employer] [facility]) Why consider waterbirth? Gentle birth for babies  Less pain medicine used in labor  May allow for passive descent/less pushing  May reduce perineal tears  More mobility and instinctive maternal position changes  Increased maternal relaxation   Is waterbirth safe? What are the risks of infection, drowning or other complications? Infection:  Very low risk (3.7 % for tub vs 4.8% for bed)  7 in 8000 waterbirths with documented infection  Poorly cleaned equipment most common cause  Slightly lower group B strep transmission rate  Drowning  Maternal:  Very low risk  Related to seizures or fainting  Newborn:  Very low risk. No evidence of increased risk of respiratory problems in multiple large studies  Physiological protection from breathing under water  Avoid underwater birth if there are any fetal complications  Once baby's head is out of the water, keep it out.  Birth complication  Some reports of cord trauma, but risk decreased by bringing baby to surface gradually  No evidence of increased risk of shoulder dystocia. Mothers can usually change positions faster in water than in a bed, possibly aiding the maneuvers to free the shoulder.   There are 2 things you MUST do to have a waterbirth with [facility]: Attend a waterbirth class at [facility] at [facility]   3rd Wednesday of every month from 7-9 pm (virtual during COVID) [employer] at [email or web address] or [email or web address] or by calling [phone] Bring [place] the certificate from the class to your prenatal appointment or send via MyChart Meet with a midwife at 36 weeks* to see if you can still plan a waterbirth and to sign the consent.   *We also recommend that you schedule as many of your prenatal visits with a midwife as possible.    Helpful information: You may want to bring a bathing suit top to the hospital  to wear during labor but this is optional.  All other supplies are provided by the hospital. Please arrive at the hospital with signs of active labor, and do not wait at home until late in labor. It takes 45 min- 1 hour for fetal monitoring, and check in to your room to take place, plus transport and filling of the waterbirth tub.    Things that would prevent you from having a waterbirth: Premature, <37wks  Previous cesarean birth  Presence of thick meconium-stained fluid  Multiple gestation (Twins, triplets, etc.)  Uncontrolled diabetes or gestational diabetes requiring medication  Hypertension diagnosed in pregnancy or preexisting hypertension (gestational hypertension, preeclampsia, or chronic hypertension) Fetal growth restriction (your baby measures less than 10th percentile on ultrasound) Heavy vaginal bleeding  Non-reassuring fetal heart rate  Active infection (MRSA, etc.). Group B Strep is NOT a contraindication for waterbirth.  If your labor has to be induced and induction method requires continuous monitoring of the baby's heart rate  Other risks/issues identified by your obstetrical provider   Please remember that birth is unpredictable. Under certain unforeseeable circumstances your provider may advise against giving birth in the tub. These decisions will be made on a case-by-case basis and with the safety of you and your baby as our highest priority.

## 2023-12-06 ENCOUNTER — Encounter: Payer: Self-pay | Admitting: Obstetrics and Gynecology

## 2024-04-20 ENCOUNTER — Encounter: Payer: Self-pay | Admitting: Obstetrics and Gynecology

## 2024-04-21 ENCOUNTER — Other Ambulatory Visit: Payer: Self-pay | Admitting: *Deleted

## 2024-04-21 DIAGNOSIS — Z333 Pregnant state, gestational carrier: Secondary | ICD-10-CM

## 2024-04-21 DIAGNOSIS — O3680X0 Pregnancy with inconclusive fetal viability, not applicable or unspecified: Secondary | ICD-10-CM

## 2024-04-21 DIAGNOSIS — Z3490 Encounter for supervision of normal pregnancy, unspecified, unspecified trimester: Secondary | ICD-10-CM

## 2024-05-02 ENCOUNTER — Other Ambulatory Visit: Payer: Self-pay

## 2024-05-02 ENCOUNTER — Ambulatory Visit (INDEPENDENT_AMBULATORY_CARE_PROVIDER_SITE_OTHER): Payer: Self-pay

## 2024-05-02 DIAGNOSIS — Z3490 Encounter for supervision of normal pregnancy, unspecified, unspecified trimester: Secondary | ICD-10-CM

## 2024-05-02 DIAGNOSIS — Z333 Pregnant state, gestational carrier: Secondary | ICD-10-CM

## 2024-05-02 DIAGNOSIS — Z3A01 Less than 8 weeks gestation of pregnancy: Secondary | ICD-10-CM

## 2024-05-02 DIAGNOSIS — O3680X0 Pregnancy with inconclusive fetal viability, not applicable or unspecified: Secondary | ICD-10-CM

## 2024-05-02 DIAGNOSIS — Z3491 Encounter for supervision of normal pregnancy, unspecified, first trimester: Secondary | ICD-10-CM

## 2024-05-03 ENCOUNTER — Telehealth: Payer: Self-pay | Admitting: *Deleted

## 2024-05-03 LAB — ESTRADIOL: Estradiol: 946 pg/mL

## 2024-05-03 LAB — PROGESTERONE: Progesterone: 19.1 ng/mL

## 2024-05-03 NOTE — Telephone Encounter (Signed)
 Pt left message requesting US  result from yesterday. She further stated that her [facility] is requesting the result and she is not able to see it in Mychart. She has reviewed her lab results and has [facility] those [facility] to [facility]. Per chart review, US  has not been read by Dr. Cleatus yet. I called pt and advised her that it can take 48 hrs or so for the report to be final. She will be able to see the result in Mychart once it has been read by physician. Pt voiced understanding and expressed gratitude.

## 2024-05-04 ENCOUNTER — Other Ambulatory Visit: Payer: Self-pay

## 2024-05-08 ENCOUNTER — Telehealth: Payer: Self-pay | Admitting: Family Medicine

## 2024-05-08 ENCOUNTER — Telehealth: Payer: Self-pay

## 2024-05-08 NOTE — Telephone Encounter (Unsigned)
 Called pt in regards to her MyChart message. Pt stated that she did not understand why she has not received her results for ultrasound.  I explained to the pt that the results just resulted about an hour ago and the provider has not seen it to release.  I advised pt that we can provide a her a proof of verifcation letter so that she can give to her fertility office and then once her US  results she will access it via MyChart.   Pt agreed and did not have any other questions.   Tyronda Vizcarrondo, RN

## 2024-05-08 NOTE — Telephone Encounter (Signed)
 Patient concern addressed in other encounter.    Emily Miller

## 2024-05-08 NOTE — Telephone Encounter (Signed)
 Patient came in the office because she was told that she would receive a call about her test results from last week. She said it is past the time frame the nurse told her she would get a call back and she has been calling and calling and leaving messages and has not heard anything in regards to her results that she needs urgently.

## 2024-05-18 ENCOUNTER — Other Ambulatory Visit (HOSPITAL_COMMUNITY)
Admission: RE | Admit: 2024-05-18 | Discharge: 2024-05-18 | Disposition: A | Discharge status: Home / Self Care | Payer: Self-pay | Source: Ambulatory Visit | Attending: Family Medicine | Admitting: Family Medicine

## 2024-05-18 ENCOUNTER — Other Ambulatory Visit: Payer: Self-pay

## 2024-05-18 ENCOUNTER — Ambulatory Visit (INDEPENDENT_AMBULATORY_CARE_PROVIDER_SITE_OTHER): Payer: Self-pay | Admitting: General Practice

## 2024-05-18 ENCOUNTER — Telehealth: Payer: Self-pay

## 2024-05-18 ENCOUNTER — Ambulatory Visit (INDEPENDENT_AMBULATORY_CARE_PROVIDER_SITE_OTHER): Payer: Self-pay

## 2024-05-18 VITALS — BP 116/71 | HR 85 | Ht 68.0 in | Wt 222.0 lb

## 2024-05-18 DIAGNOSIS — Z3A08 8 weeks gestation of pregnancy: Secondary | ICD-10-CM | POA: Diagnosis not present

## 2024-05-18 DIAGNOSIS — N898 Other specified noninflammatory disorders of vagina: Secondary | ICD-10-CM

## 2024-05-18 DIAGNOSIS — Z3491 Encounter for supervision of normal pregnancy, unspecified, first trimester: Secondary | ICD-10-CM

## 2024-05-18 DIAGNOSIS — Z3490 Encounter for supervision of normal pregnancy, unspecified, unspecified trimester: Secondary | ICD-10-CM

## 2024-05-18 NOTE — Progress Notes (Signed)
 Patient presents to office today reporting vaginal itching starting a week and a half ago, took some monistat OTC for a few days which did help some but still having some irritation. No vaginal discharge or odor. Patient instructed in self swab & specimen collected. Advised results will be back in 24-48 hours and available via mychart.   Elenor DEL RN BSN 05/18/24

## 2024-05-18 NOTE — Telephone Encounter (Signed)
 Received call from [place] at [facility] requesting records of progesterone  and estradiol  levels as well as US  results. Will send message to front office to have these records faxed to their facility for their review.   Cyndee Molt, RN

## 2024-05-19 LAB — ESTRADIOL: Estradiol: 1859 pg/mL

## 2024-05-19 LAB — CERVICOVAGINAL ANCILLARY ONLY
Bacterial Vaginitis (gardnerella): NEGATIVE
Candida Glabrata: NEGATIVE
Candida Vaginitis: NEGATIVE
Comment: NEGATIVE
Comment: NEGATIVE
Comment: NEGATIVE
Comment: NEGATIVE
Trichomonas: NEGATIVE

## 2024-05-19 LAB — PROGESTERONE: Progesterone: 21.1 ng/mL

## 2024-05-30 ENCOUNTER — Telehealth: Payer: Self-pay

## 2024-05-30 DIAGNOSIS — Z3401 Encounter for supervision of normal first pregnancy, first trimester: Secondary | ICD-10-CM

## 2024-05-30 DIAGNOSIS — Z3A10 10 weeks gestation of pregnancy: Secondary | ICD-10-CM | POA: Diagnosis not present

## 2024-05-30 DIAGNOSIS — Z3402 Encounter for supervision of normal first pregnancy, second trimester: Secondary | ICD-10-CM

## 2024-05-30 DIAGNOSIS — O0990 Supervision of high risk pregnancy, unspecified, unspecified trimester: Secondary | ICD-10-CM

## 2024-05-30 NOTE — Patient Instructions (Signed)
Safe Medications in Pregnancy   Acne: Benzoyl Peroxide Salicylic Acid  Backache/Headache: Tylenol: 2 regular strength every 4 hours OR              2 Extra strength every 6 hours  Colds/Coughs/Allergies: Benadryl (alcohol free) 25 mg every 6 hours as needed Breath right strips Claritin Cepacol throat lozenges Chloraseptic throat spray Cold-Eeze- up to three times per day Cough drops, alcohol free Flonase (by prescription only) Guaifenesin Mucinex Robitussin DM (plain only, alcohol free) Saline nasal spray/drops Sudafed (pseudoephedrine) & Actifed ** use only after [redacted] weeks gestation and if you do not have high blood pressure Tylenol Vicks Vaporub Zinc lozenges Zyrtec   Constipation: Colace Ducolax suppositories Fleet enema Glycerin suppositories Metamucil Milk of magnesia Miralax Senokot Smooth move tea  Diarrhea: Kaopectate Imodium A-D  *NO pepto Bismol  Hemorrhoids: Anusol Anusol HC Preparation H Tucks  Indigestion: Tums Maalox Mylanta Zantac  Pepcid  Insomnia: Benadryl (alcohol free) 25mg every 6 hours as needed Tylenol PM Unisom, no Gelcaps  Leg Cramps: Tums MagGel  Nausea/Vomiting:  Bonine Dramamine Emetrol Ginger extract Sea bands Meclizine  Nausea medication to take during pregnancy:  Unisom (doxylamine succinate 25 mg tablets) Take one tablet daily at bedtime. If symptoms are not adequately controlled, the dose can be increased to a maximum recommended dose of two tablets daily (1/2 tablet in the morning, 1/2 tablet mid-afternoon and one at bedtime). Vitamin B6 100mg tablets. Take one tablet twice a day (up to 200 mg per day).  Skin Rashes: Aveeno products Benadryl cream or 25mg every 6 hours as needed Calamine Lotion 1% cortisone cream  Yeast infection: Gyne-lotrimin 7 Monistat 7   **If taking multiple medications, please check labels to avoid duplicating the same active ingredients **take medication as directed on  the label ** Do not exceed 4000 mg of tylenol in 24 hours **Do not take medications that contain aspirin or ibuprofen      Considering Waterbirth? Guide for patients at [facility] Why consider waterbirth? Gentle birth for babies  Less pain medicine used in labor  May allow for passive descent/less pushing  May reduce perineal tears  More mobility and instinctive maternal position changes  Increased maternal relaxation   Is waterbirth safe? What are the risks of infection, drowning or other complications? Infection:  Very low risk (3.7 % for tub vs 4.8% for bed)  7 in 8000 waterbirths with documented infection  Poorly cleaned equipment most common cause  Slightly lower group B strep transmission rate  Drowning  Maternal:  Very low risk  Related to seizures or fainting  Newborn:  Very low risk. No evidence of increased risk of respiratory problems in multiple large studies  Physiological protection from breathing under water  Avoid underwater birth if there are any fetal complications  Once baby's head is out of the water, keep it out.  Birth complication  Some reports of cord trauma, but risk decreased by bringing baby to surface gradually  No evidence of increased risk of shoulder dystocia. Mothers can usually change positions faster in water than in a bed, possibly aiding the maneuvers to free the shoulder.   There are 2 things you MUST do to have a waterbirth with [facility]: Attend a [facility] class [facility]    3rd Wednesday of every month from 7-9 pm (virtual during COVID) Free Register online at [email or web address] or [email or web address] or by calling [phone] Bring us the certificate from the class to   your prenatal appointment or send via MyChart Meet with a midwife at 36 weeks* to see if you can still plan a waterbirth and to sign the consent.   *We also recommend that you schedule as many of your  prenatal visits with a midwife as possible.    Helpful information: You may want to bring a bathing suit top to the hospital to wear during labor but this is optional.  All other supplies are provided by the hospital. Please arrive at the hospital with signs of active labor, and do not wait at home until late in labor. It takes 45 min- 1 hour for fetal monitoring, and check in to your room to take place, plus transport and filling of the waterbirth tub.    Things that would prevent you from having a waterbirth: Premature, <37wks  Previous cesarean birth  Presence of thick meconium-stained fluid  Multiple gestation (Twins, triplets, etc.)  Uncontrolled diabetes or gestational diabetes requiring medication  Hypertension diagnosed in pregnancy or preexisting hypertension (gestational hypertension, preeclampsia, or chronic hypertension) Fetal growth restriction (your baby measures less than 10th percentile on ultrasound) Heavy vaginal bleeding  Non-reassuring fetal heart rate  Active infection (MRSA, etc.). Group B Strep is NOT a contraindication for waterbirth.  If your labor has to be induced and induction method requires continuous monitoring of the baby's heart rate  Other risks/issues identified by your obstetrical provider   Please remember that birth is unpredictable. Under certain unforeseeable circumstances your provider may advise against giving birth in the tub. These decisions will be made on a case-by-case basis and with the safety of you and your baby as our highest priority.    Updated 01/14/22   Options for Doula Care in the [place] Area  As you review your birthing options, consider having a birth doula. A doula is trained to provide support before, during and just after you give birth. There are also postpartum doulas that help you adjust to new parenthood.  While doulas do not provide medical care, they do provide emotional, physical and educational support. A few months  before your baby arrives, doulas can help answer questions, ease concerns and help you create and support your birthing plan.    Doulas can help reduce your stress and comfort you and your partner. They can help you cope with labor by helping you use breathing techniques, massage, creative labor positioning, essential oils and affirmations.   Studies show that the benefits of having a doula include:   A more positive birth experience  Fewer requests for pain-relief medication  Less likelihood of cesarean section, commonly called a c-section   Doulas are typically hired via a fee and contract between you and the doula. We are happy to provide a list of the most active doulas in the area, all of whom are credentialed by [facility] and will not count as a visitor at your birth.  There are several options for no-cost doula care at our hospital, including:  [facility] Volunteer Doula Program Every Baby [place] Doula Program A Cure 4 Moms Doula Study (available only at [facility], [facility], [place] and [place] [facility] offices)  For more information on these programs or to receive a list of doulas active in our area, please email [email or web address]         

## 2024-05-30 NOTE — Progress Notes (Signed)
 New OB Intake  I connected with Emily Miller  on 05/30/24 at  1:15 PM EDT by MyChart Video Visit and verified that I am speaking with the correct person using two identifiers. Nurse is located at [facility] and pt is located at Work.  I discussed the limitations, risks, security and privacy concerns of performing an evaluation and management service by telephone and the availability of in person appointments. I also discussed with the patient that there may be a patient responsible charge related to this service. The patient expressed understanding and agreed to proceed.  I explained I am completing New OB Intake today. We discussed EDD of 12/23/2024 based on US  at [redacted]w[redacted]d weeks. Pt is [id number]. I reviewed her allergies, medications and Medical/Surgical/OB history.    Patient Active Problem List   Diagnosis Date Noted   Inflammatory arthritis 08/26/2014   Supervision of high risk pregnancy, antepartum 08/26/2014   Asthma 08/26/2014     Concerns addressed today  Delivery Plans Plans to deliver at [facility]. Discussed the nature of our practice with multiple providers including residents and students as well as female and female providers. Due to the size of the practice, the delivering provider may not be the same as those providing prenatal care.   Patient is interested in water birth.  MyChart/Babyscripts MyChart access verified. I explained pt will have some visits in office and some virtually. Babyscripts instructions given and order placed. Patient verifies receipt of registration text/e-mail. Account successfully created and app downloaded. If patient is a candidate for Optimized scheduling, add to sticky note.   Blood Pressure Cuff/Weight Scale Patient has private insurance; instructed to purchase blood pressure cuff and bring to first prenatal appt. Explained after first prenatal appt pt will check weekly and document in Babyscripts. Patient does have weight scale.  Anatomy US  Explained  first scheduled US  will be around 19 weeks. Anatomy US  scheduled for 08/02/2024 at 1pm.  Is patient a CenteringPregnancy candidate?  Declined Declined due to Schedule Not a candidate due to  If accepted,    Is patient a Mom+Baby Combined Care candidate?  Not a candidate   If accepted, confirm patient does not intend to move from the area for at least 12 months, then notify Mom+Baby staff  Is patient a candidate for Babyscripts Optimization? Yes, patient declined   First visit review I reviewed new OB appt with patient. Explained pt will be seen by Cornell Finder, CNM at first visit. Discussed [place] genetic screening with patient. Will discuss with the parents before new OB visit [facility].. Routine prenatal labs is needed at new OB visit.   Last Pap Diagnosis  Date Value Ref Range Status  02/20/2021   Final   - Negative for intraepithelial lesion or malignancy (NILM)    Cyndee JAYSON Molt, RN 05/30/2024  1:33 PM

## 2024-06-07 ENCOUNTER — Encounter: Payer: Self-pay | Admitting: Certified Nurse Midwife

## 2024-06-07 ENCOUNTER — Ambulatory Visit: Payer: Self-pay | Admitting: Certified Nurse Midwife

## 2024-06-07 ENCOUNTER — Other Ambulatory Visit: Payer: Self-pay

## 2024-06-07 ENCOUNTER — Other Ambulatory Visit (HOSPITAL_COMMUNITY)
Admission: RE | Admit: 2024-06-07 | Discharge: 2024-06-07 | Disposition: A | Discharge status: Home / Self Care | Source: Ambulatory Visit | Attending: Certified Nurse Midwife | Admitting: Certified Nurse Midwife

## 2024-06-07 VITALS — BP 121/83 | HR 94 | Wt 225.0 lb

## 2024-06-07 DIAGNOSIS — Z6834 Body mass index (BMI) 34.0-34.9, adult: Secondary | ICD-10-CM | POA: Diagnosis not present

## 2024-06-07 DIAGNOSIS — Z1332 Encounter for screening for maternal depression: Secondary | ICD-10-CM | POA: Diagnosis not present

## 2024-06-07 DIAGNOSIS — O0990 Supervision of high risk pregnancy, unspecified, unspecified trimester: Secondary | ICD-10-CM | POA: Insufficient documentation

## 2024-06-07 DIAGNOSIS — O0991 Supervision of high risk pregnancy, unspecified, first trimester: Secondary | ICD-10-CM

## 2024-06-07 DIAGNOSIS — Z333 Pregnant state, gestational carrier: Secondary | ICD-10-CM

## 2024-06-07 DIAGNOSIS — O09521 Supervision of elderly multigravida, first trimester: Secondary | ICD-10-CM

## 2024-06-07 DIAGNOSIS — Z3A11 11 weeks gestation of pregnancy: Secondary | ICD-10-CM

## 2024-06-07 DIAGNOSIS — M1990 Unspecified osteoarthritis, unspecified site: Secondary | ICD-10-CM | POA: Diagnosis not present

## 2024-06-07 DIAGNOSIS — Z3491 Encounter for supervision of normal pregnancy, unspecified, first trimester: Secondary | ICD-10-CM

## 2024-06-07 NOTE — Progress Notes (Addendum)
 INITIAL PRENATAL VISIT  History:  Emily Miller is a 38 y.o. [phone] at [redacted]w[redacted]d by date of embryo transfer being seen today for her first obstetrical visit.  Her obstetrical history is significant for advanced maternal age and 3 prior uncomplicated pregnancies/deliveries (last one at home in 2016). Is a gestational carrier this time, considering if parents will accept pumped breastmilk. Pregnancy history fully reviewed.  Patient reports no complaints.  HISTORY: OB History  Gravida Para Term Preterm AB Living  5 3 3  0 1 3  SAB IAB Ectopic Multiple Live Births  0 1 0 0 3    # Outcome Date GA Lbr Len/2nd Weight Sex Type Anes PTL Lv  5 Current           4 Term 2016 [redacted]w[redacted]d  8 lb 8 oz (3.856 kg) F Vag-Spont None N LIV  3 Term 2014 [redacted]w[redacted]d  8 lb 5 oz (3.771 kg) M Vag-Spont None N LIV  2 Term 2011 [redacted]w[redacted]d  8 lb (3.629 kg) M Vag-Spont None N LIV  1 IAB 2005            Last pap smear was done 02/20/2021 and was normal  Past Medical History:  Diagnosis Date   Asthma    As a child. Not treated daily as an adult   Hemorrhoid    Inflammatory arthritis 2018   Insulin resistance    Joint pain    Multiple food allergies    Osteoarthritis    Spondylolisthesis    Vitamin D deficiency    Past Surgical History:  Procedure Laterality Date   TONSILECTOMY/ADENOIDECTOMY WITH MYRINGOTOMY     Family History  Problem Relation Age of Onset   Anxiety disorder Mother    Hypertension Mother    Stroke Mother    Obesity Father    Social History   Tobacco Use   Smoking status: Never   Smokeless tobacco: Never  Vaping Use   Vaping status: Never Used  Substance Use Topics   Alcohol use: Not Currently    Comment: soically   Drug use: Never   Allergies  Allergen Reactions   Gluten Meal      Sometimes eating too much bready stuff makes my hands hurt, I think it may be associated with my rheumatoid arthritis.    Current Outpatient Medications on File Prior to Visit  Medication Sig Dispense  Refill   aspirin EC 81 MG tablet Take 81 mg by mouth daily. Swallow whole.     cholecalciferol  (VITAMIN D3) 25 MCG (1000 UNIT) tablet Take 1,000 Units by mouth daily.     estradiol  (ESTRACE ) 2 MG tablet Take 2 mg by mouth in the morning and at bedtime.     Omega-3 Fatty Acids (FISH OIL) 300 MG CAPS Take by mouth.     Prenatal Vit-Fe Fumarate-FA (MULTIVITAMIN-PRENATAL) 27-0.8 MG TABS tablet Take 1 tablet by mouth daily at 12 noon.     No current facility-administered medications on file prior to visit.   Review of Systems Pertinent items noted in HPI and remainder of comprehensive ROS otherwise negative.  Indications for ASA therapy (per UpToDate) One of the following: (Needs 162 mg daily) Previous pregnancy with preeclampsia, especially early onset and with an adverse outcome No Chronic hypertension No Type 1 or 2 diabetes mellitus No Multifetal gestation No Chronic kidney disease No Autoimmune disease (antiphospholipid syndrome, systemic lupus erythematosus) Yes Inflammatory arthirtis Two or more of the following: (Can do 81 mg daily) Nulliparity No Obesity (body mass  index >30 kg/m2) Yes Family history of preeclampsia in mother or sister No Age >=35 years Yes Sociodemographic characteristics (African American race, low socioeconomic level) No Personal risk factors (eg, previous pregnancy with low birth weight or small for gestational age infant, previous adverse pregnancy outcome [eg, stillbirth], interval >10 years between pregnancies) No In vitro conception Yes  Physical Exam:   Vitals:   06/07/24 1020  BP: 121/83  Pulse: 94  Weight: 225 lb (102.1 kg)   Fetal Heart Rate (bpm): 164    General: well-developed, well-nourished female in no acute distress  Breasts:  normal appearance, no masses or tenderness bilaterally, exam done in the presence of a chaperone.   Skin: normal coloration and turgor, no rashes  Neurologic: oriented, normal, negative, normal mood  Extremities:  normal strength, tone, and muscle mass, ROM of all joints is normal  HEENT PERRLA, extraocular movement intact and sclera clear, anicteric  Neck supple and no masses  Cardiovascular: regular rate and rhythm  Respiratory:  no respiratory distress, normal breath sounds  Abdomen: soft, non-tender; bowel sounds normal; no masses,  no organomegaly  Pelvic: Deferred   Assessment:  Pregnancy: [id number] Patient Active Problem List   Diagnosis Date Noted   History of hyperthyroidism 06/10/2024   Advanced maternal age in multigravida 06/10/2024   BMI 34.0-34.9,adult 06/10/2024   Inflammatory arthritis 08/26/2014   Supervision of high-risk pregnancy 08/26/2014   Plan:  1. Supervision of high risk pregnancy, antepartum (Primary) - Doing well. BP and FHR normal today - Anatomy US  scheduled for 08/02/24 - Culture, OB Urine - Hemoglobin A1c - CBC/D/Plt+RPR+Rh+ABO+RubIgG... - GC/Chlamydia probe amp ([place])not at [facility] PRENATAL TEST  2. Pregnant state, gestational carrier - Parents in [place], will arrive for delivery  3. Inflammatory arthritis - Followed by Rheumatology  4. Multigravida of advanced maternal age in first trimester - Recommended daily 81mg  aspirin, will be followed by MFM, likely to recommend 39wk IOL  5. BMI 34.0-34.9,adult - Will be followed by MFM - Weight gain recommendations per IOM guidelines reviewed: underweight/BMI 18.5 or less > 28 - 40 lbs; normal weight/BMI 18.5 - 24.9 > 25 - 35 lbs; overweight/BMI 25 - 29.9 > 15 - 25 lbs; obese/BMI 30 or more > 11 - 20 lbs. - Discussed healthy weight redistribution during pregnancy using regular intake of balanced meals, not skipping meals, and regular movement.  6. Initial obstetric visit in first trimester - Initial labs drawn. - Continue prenatal vitamins, extra vitamin D and DHA - Problem list reviewed and updated. - Genetic Screening discussed, Panorama: ordered. - Ultrasound discussed; fetal anatomic survey:  scheduled. - Anticipatory guidance about prenatal visits given including labs, ultrasounds, and testing. - Discussed usage of the Babyscripts app for more information about pregnancy, and to track blood pressures. - Also discussed usage of virtual visits as additional source of managing and completing prenatal visits.  - Patient was encouraged to use MyChart to review results, send requests, and have questions addressed.   - Pt will be receiving continuous prenatal care  Routine obstetric precautions reviewed. Encouraged to seek out care at our office or emergency room [facility] preferred) for urgent and/or emergent concerns. Return in about 4 weeks (around 07/05/2024) for ROB and AFP.   Future Appointments  Date Time Provider Department Center  07/03/2024 10:15 AM Milly Olam DELENA EDDY [facility]  08/02/2024  8:00 AM [facility] PROVIDER 1 [facility]  08/02/2024  8:30 AM [facility] US1 [facility]  08/02/2024  1:55 PM Courtez Twaddle,  Cornell JONELLE HOWARD [facility]  08/30/2024 11:15 AM Vannie Cornell JONELLE, CNM [facility]  09/27/2024  8:40 AM [facility] LAB [facility]  09/27/2024 10:15 AM Vannie Cornell JONELLE, CNM [facility]  10/18/2024 11:15 AM Vannie Cornell JONELLE, CNM [facility]  11/01/2024 10:55 AM Vannie Cornell JONELLE, CNM [facility]  11/15/2024 10:55 AM Vannie Cornell JONELLE, CNM [facility]  11/29/2024 10:55 AM Vannie Cornell JONELLE, CNM [facility]  12/06/2024 10:55 AM Vannie Cornell JONELLE, CNM [facility]  12/13/2024 10:55 AM Vannie Cornell JONELLE, CNM [facility]  12/20/2024 10:55 AM Vannie Cornell JONELLE, CNM [facility]  12/27/2024 10:55 AM Vannie Cornell JONELLE, CNM [facility]    Cornell Vannie, CNM, MSN, IBCLC Certified Nurse Midwife, [facility]

## 2024-06-08 LAB — CBC/D/PLT+RPR+RH+ABO+RUBIGG...
Antibody Screen: NEGATIVE
Basophils Absolute: 0 x10E3/uL (ref 0.0–0.2)
Basos: 0 %
EOS (ABSOLUTE): 0.1 x10E3/uL (ref 0.0–0.4)
Eos: 1 %
HCV Ab: NONREACTIVE
HIV Screen 4th Generation wRfx: NONREACTIVE
Hematocrit: 40.1 % (ref 34.0–46.6)
Hemoglobin: 13.1 g/dL (ref 11.1–15.9)
Hepatitis B Surface Ag: NEGATIVE
Immature Grans (Abs): 0.1 x10E3/uL (ref 0.0–0.1)
Immature Granulocytes: 1 %
Lymphocytes Absolute: 2 x10E3/uL (ref 0.7–3.1)
Lymphs: 22 %
MCH: 30.9 pg (ref 26.6–33.0)
MCHC: 32.7 g/dL (ref 31.5–35.7)
MCV: 95 fL (ref 79–97)
Monocytes Absolute: 0.5 x10E3/uL (ref 0.1–0.9)
Monocytes: 5 %
Neutrophils Absolute: 6.6 x10E3/uL (ref 1.4–7.0)
Neutrophils: 71 %
Platelets: 311 x10E3/uL (ref 150–450)
RBC: 4.24 x10E6/uL (ref 3.77–5.28)
RDW: 12.2 % (ref 11.7–15.4)
RPR Ser Ql: NONREACTIVE
Rh Factor: NEGATIVE
Rubella Antibodies, IGG: 1.17 {index} (ref >0.99)
WBC: 9.2 x10E3/uL (ref 3.4–10.8)

## 2024-06-08 LAB — HEMOGLOBIN A1C
Est. average glucose Bld gHb Est-mCnc: 97 mg/dL
Hgb A1c MFr Bld: 5 % (ref 4.8–5.6)

## 2024-06-08 LAB — HCV INTERPRETATION

## 2024-06-09 LAB — GC/CHLAMYDIA PROBE AMP (~~LOC~~) NOT AT ARMC
Chlamydia: NEGATIVE
Comment: NEGATIVE
Comment: NORMAL
Neisseria Gonorrhea: NEGATIVE

## 2024-06-09 LAB — URINE CULTURE, OB REFLEX

## 2024-06-09 LAB — CULTURE, OB URINE

## 2024-06-10 DIAGNOSIS — O09529 Supervision of elderly multigravida, unspecified trimester: Secondary | ICD-10-CM | POA: Insufficient documentation

## 2024-06-10 DIAGNOSIS — Z8639 Personal history of other endocrine, nutritional and metabolic disease: Secondary | ICD-10-CM | POA: Insufficient documentation

## 2024-06-10 DIAGNOSIS — Z6834 Body mass index (BMI) 34.0-34.9, adult: Secondary | ICD-10-CM | POA: Insufficient documentation

## 2024-06-19 ENCOUNTER — Encounter: Payer: Self-pay | Admitting: Certified Nurse Midwife

## 2024-06-19 LAB — PANORAMA PRENATAL TEST FULL PANEL:PANORAMA TEST PLUS 5 ADDITIONAL MICRODELETIONS

## 2024-06-21 ENCOUNTER — Encounter: Payer: Self-pay | Admitting: Certified Nurse Midwife

## 2024-06-21 LAB — PANORAMA PRENATAL TEST FULL PANEL:PANORAMA TEST PLUS 5 ADDITIONAL MICRODELETIONS: FETAL FRACTION: 7

## 2024-06-22 ENCOUNTER — Other Ambulatory Visit: Payer: Self-pay | Admitting: Certified Nurse Midwife

## 2024-06-22 DIAGNOSIS — O98511 Other viral diseases complicating pregnancy, first trimester: Secondary | ICD-10-CM

## 2024-06-22 MED ORDER — PAXLOVID (300/100) 20 X 150 MG & 10 X 100MG PO TBPK: 3.0000 | ORAL_TABLET | Freq: Two times a day (BID) | ORAL | 0 refills | Status: AC

## 2024-07-03 ENCOUNTER — Other Ambulatory Visit: Payer: Self-pay

## 2024-07-03 ENCOUNTER — Ambulatory Visit: Admitting: Advanced Practice Midwife

## 2024-07-03 ENCOUNTER — Encounter: Payer: Self-pay | Admitting: Advanced Practice Midwife

## 2024-07-03 VITALS — BP 115/75 | HR 107 | Wt 228.0 lb

## 2024-07-03 DIAGNOSIS — O0990 Supervision of high risk pregnancy, unspecified, unspecified trimester: Secondary | ICD-10-CM

## 2024-07-03 DIAGNOSIS — O0992 Supervision of high risk pregnancy, unspecified, second trimester: Secondary | ICD-10-CM

## 2024-07-03 DIAGNOSIS — O09522 Supervision of elderly multigravida, second trimester: Secondary | ICD-10-CM

## 2024-07-03 DIAGNOSIS — O09529 Supervision of elderly multigravida, unspecified trimester: Secondary | ICD-10-CM

## 2024-07-03 DIAGNOSIS — Z3A15 15 weeks gestation of pregnancy: Secondary | ICD-10-CM

## 2024-07-03 NOTE — Progress Notes (Signed)
   PRENATAL VISIT NOTE  Subjective:  Emily Miller is a 38 y.o. H4E6986 at [redacted]w[redacted]d being seen today for ongoing prenatal care.  She is currently monitored for the following issues for this high-risk pregnancy and has Inflammatory arthritis; Supervision of high-risk pregnancy; History of hyperthyroidism; Advanced maternal age in multigravida; and BMI 34.0-34.9,adult on their problem list.  Patient reports no complaints.  Contractions: Not present. Vag. Bleeding: None.  Movement: Absent. Denies leaking of fluid.   The following portions of the patient's history were reviewed and updated as appropriate: allergies, current medications, past family history, past medical history, past social history, past surgical history and problem list.   Objective:    Vitals:   07/03/24 1108  BP: 115/75  Pulse: (!) 107  Weight: 228 lb (103.4 kg)    Fetal Status:  Fetal Heart Rate (bpm): 154   Movement: Absent    General: Alert, oriented and cooperative. Patient is in no acute distress.  Skin: Skin is warm and dry. No rash noted.   Cardiovascular: Normal heart rate noted  Respiratory: Normal respiratory effort, no problems with respiration noted  Abdomen: Soft, gravid, appropriate for gestational age.  Pain/Pressure: Absent     Pelvic: Cervical exam deferred        Extremities: Normal range of motion.  Edema: None  Mental Status: Normal mood and affect. Normal behavior. Normal judgment and thought content.   Assessment and Plan:  Pregnancy: [id number] at [redacted]w[redacted]d 1. Supervision of high risk pregnancy, antepartum (Primary) --Anticipatory guidance about next visits/weeks of pregnancy given.  --Discussed surrogate pregnancy, AMA over 40, recommended antenatal testing --Pt desires waterbirth, has had waterbirth x 3.  Discussed class, pt plans to see midwives [place]) during pregnancy. WB class info sent. Discussed waterbirth as low risk pregnancy option.  Pt high risk for AMA, no barriers to waterbirth at this  time.   - AFP, Serum, Open Spina Bifida  2. [redacted] weeks gestation of pregnancy    Preterm labor symptoms and general obstetric precautions including but not limited to vaginal bleeding, contractions, leaking of fluid and fetal movement were reviewed in detail with the patient. Please refer to After Visit Summary for other counseling recommendations.   No follow-ups on file.  Future Appointments  Date Time Provider Department Center  08/02/2024  8:00 AM [facility] PROVIDER 1 [facility]  08/02/2024  8:30 AM [facility] US1 [facility]  08/02/2024  1:55 PM Vannie Cornell JONELLE EDDY [facility]  08/30/2024 11:15 AM Vannie Cornell JONELLE, CNM [facility]  09/27/2024  8:40 AM [facility] LAB [facility]  09/27/2024 10:15 AM Vannie Cornell JONELLE, CNM [facility]  10/18/2024 11:15 AM Vannie Cornell JONELLE, CNM [facility]  11/01/2024 10:55 AM Vannie Cornell JONELLE, CNM [facility]  11/15/2024 10:55 AM Vannie Cornell JONELLE, CNM [facility]  11/29/2024 10:55 AM Vannie Cornell JONELLE, CNM [facility]  12/06/2024 10:55 AM Vannie Cornell JONELLE, CNM [facility]  12/13/2024 10:55 AM Vannie Cornell JONELLE, CNM [facility]  12/20/2024 10:55 AM Vannie Cornell JONELLE, CNM [id number]  12/27/2024 10:55 AM Vannie Cornell JONELLE, CNM [facility]    Olam Boards, CNM

## 2024-07-05 LAB — AFP, SERUM, OPEN SPINA BIFIDA
AFP MoM: 0.75
AFP Value: 16.8 ng/mL
Gest. Age on Collection Date: 15 wk
Maternal Age At EDD: 40.8 a
OSBR Risk 1 IN: 10000
Test Results:: NEGATIVE
Weight: 228 [lb_av]

## 2024-08-02 ENCOUNTER — Ambulatory Visit: Payer: Self-pay

## 2024-08-02 ENCOUNTER — Other Ambulatory Visit: Payer: Self-pay

## 2024-08-02 ENCOUNTER — Other Ambulatory Visit: Payer: Self-pay | Admitting: *Deleted

## 2024-08-02 ENCOUNTER — Encounter: Admitting: Certified Nurse Midwife

## 2024-08-02 ENCOUNTER — Ambulatory Visit: Payer: Self-pay | Attending: Obstetrics and Gynecology | Admitting: Maternal & Fetal Medicine

## 2024-08-02 VITALS — BP 126/62 | HR 103

## 2024-08-02 DIAGNOSIS — O99212 Obesity complicating pregnancy, second trimester: Secondary | ICD-10-CM | POA: Diagnosis not present

## 2024-08-02 DIAGNOSIS — Z363 Encounter for antenatal screening for malformations: Secondary | ICD-10-CM | POA: Insufficient documentation

## 2024-08-02 DIAGNOSIS — O2692 Pregnancy related conditions, unspecified, second trimester: Secondary | ICD-10-CM | POA: Insufficient documentation

## 2024-08-02 DIAGNOSIS — Z6834 Body mass index (BMI) 34.0-34.9, adult: Secondary | ICD-10-CM

## 2024-08-02 DIAGNOSIS — O0992 Supervision of high risk pregnancy, unspecified, second trimester: Secondary | ICD-10-CM

## 2024-08-02 DIAGNOSIS — Z333 Pregnant state, gestational carrier: Secondary | ICD-10-CM | POA: Insufficient documentation

## 2024-08-02 DIAGNOSIS — O09522 Supervision of elderly multigravida, second trimester: Secondary | ICD-10-CM | POA: Insufficient documentation

## 2024-08-02 DIAGNOSIS — Z3402 Encounter for supervision of normal first pregnancy, second trimester: Secondary | ICD-10-CM

## 2024-08-02 DIAGNOSIS — O09812 Supervision of pregnancy resulting from assisted reproductive technology, second trimester: Secondary | ICD-10-CM | POA: Insufficient documentation

## 2024-08-02 DIAGNOSIS — Z3A19 19 weeks gestation of pregnancy: Secondary | ICD-10-CM | POA: Diagnosis not present

## 2024-08-02 DIAGNOSIS — E0590 Thyrotoxicosis, unspecified without thyrotoxic crisis or storm: Secondary | ICD-10-CM | POA: Insufficient documentation

## 2024-08-02 NOTE — Progress Notes (Signed)
 Patient information  Patient Name: Emily Miller  Patient MRN:   [id number]  Referring practice: MFM Referring Provider: [facility])  Problem List   Patient Active Problem List   Diagnosis Date Noted   Surrogate pregnancy, antepartum 08/02/2024   Pregnancy resulting from in vitro fertilization, second trimester 08/02/2024   Obesity during pregnancy in second trimester 08/02/2024   History of hyperthyroidism 06/10/2024   Advanced maternal age in multigravida 06/10/2024   BMI 34.0-34.9,adult 06/10/2024   Inflammatory arthritis 08/26/2014   Supervision of high-risk pregnancy 08/26/2014   Maternal Fetal Medicine Consult Emily Miller is a 38 y.o. H4E6986 at [redacted]w[redacted]d here for ultrasound and consultation.  Aneuploidy screening showed low risk female.  She has no acute concerns.   Today we focused on the following:   IVF surrogacy: The patient is a surrogate for the donor Emily Miller) and the fetus is the product of Emily Miller and her husband's genetic material.  Emily Miller chose surrogacy because she is now 20 and has had 3 previous cesarean deliveries.  She was advised by her physicians to avoid another cesarean delivery.  Since she and her husband wanted another child that was genetically consistent with the rest of their family they chose surrogacy.  Emily Miller currently lives in [place]  and is visiting for the anatomy ultrasound today.  I discussed the potential complications in pregnancy course and a pregnancy complicated by an IVF.  We discussed that many of the risks are associations that also depend on the maternal health.  Emily Miller reports that she is otherwise healthy and has had 3 uncomplicated deliveries.  She is already taking aspirin for preeclampsia risk reduction.  I discussed the utility of a fetal echocardiogram due to a possible increased risk of congenital heart disease in babies conceived by IVF.  However after our discussion the patient declined.  I discussed  that the heart appears normal today.  Due to the possible increased risk of growth restriction I recommend the patient undergo serial growth ultrasounds in third trimester with antenatal testing starting at 32 weeks.   Sonographic findings Single intrauterine pregnancy at 19w 4d  Fetal cardiac activity:  Observed and appears normal. Presentation: Cephalic. The anatomic structures that were well seen appear normal without evidence of soft markers. Due to poor acoustic windows some structures remain suboptimally visualized. Fetal biometry shows the estimated fetal weight at the 50 percentile.  Amniotic fluid: Within normal limits.  MVP: 4.26 cm. Placenta: Posterior Fundal. Adnexa: No abnormality visualized. Cervical length: 3.8 cm.  There are limitations of prenatal ultrasound such as the inability to detect certain abnormalities due to poor visualization. Various factors such as fetal position, gestational age and maternal body habitus may increase the difficulty in visualizing the fetal anatomy.    Recommendations - EDD should be 12/23/2024 based on  Embryo Transfer  (04/06/24). - Aneuploidy screening was completed at the [facility] office - Anatomy ultrasound was done today with the above findings (see report). - Aspirin 81-162 mg continued throughout the pregnancy for preeclampsia prophylaxis. - Baseline labs: CMP, CBC, urine protein creatinine ratio if not previously completed. Repeat with any concerns for preeclampsia. - Blood pressure goal of < 140 systolic and < 90 diastolic. Antihypertensive medication should be added/adjusted until BP goal is achieved. - Fetal echocardiogram was offered but declined - Serial growth ultrasounds every 4-5 weeks starting at 28 weeks until delivery. - Antenatal testing (usually weekly BPP or NST) weekly at 32 weeks until delivery. - Delivery likely  around [redacted] weeks gestation or sooner if indicated.  Review of Systems: A review of systems was performed and was  negative except per HPI   Past Obstetrical History:  OB History  Gravida Para Term Preterm AB Living  5 3 3  0 1 3  SAB IAB Ectopic Multiple Live Births  0 1 0 0 3    # Outcome Date GA Lbr Len/2nd Weight Sex Type Anes PTL Lv  5 Current           4 Term 2016 [redacted]w[redacted]d  8 lb 8 oz (3.856 kg) F Vag-Spont None N LIV  3 Term 2014 [redacted]w[redacted]d  8 lb 5 oz (3.771 kg) M Vag-Spont None N LIV  2 Term 2011 [redacted]w[redacted]d  8 lb (3.629 kg) M Vag-Spont None N LIV  1 IAB 2005             Past Medical History:  Past Medical History:  Diagnosis Date   Asthma    As a child. Not treated daily as an adult   Hemorrhoid    Inflammatory arthritis 2018   Insulin resistance    Joint pain    Multiple food allergies    Osteoarthritis    Spondylolisthesis    Vitamin D deficiency      Past Surgical History:    Past Surgical History:  Procedure Laterality Date   TONSILECTOMY/ADENOIDECTOMY WITH MYRINGOTOMY       Home Medications:   Current Outpatient Medications on File Prior to Visit  Medication Sig Dispense Refill   aspirin EC 81 MG tablet Take 81 mg by mouth daily. Swallow whole.     cholecalciferol  (VITAMIN D3) 25 MCG (1000 UNIT) tablet Take 1,000 Units by mouth daily.     hydroxychloroquine  (PLAQUENIL ) 200 MG tablet Take 200 mg by mouth daily.     Omega-3 Fatty Acids (FISH OIL) 300 MG CAPS Take by mouth.     Prenatal Vit-Fe Fumarate-FA (MULTIVITAMIN-PRENATAL) 27-0.8 MG TABS tablet Take 1 tablet by mouth daily at 12 noon.     No current facility-administered medications on file prior to visit.      Allergies:   Allergies  Allergen Reactions   Gluten Meal      Sometimes eating too much bready stuff makes my hands hurt, I think it may be associated with my rheumatoid arthritis.      Physical Exam:   Vitals:   08/02/24 0812  BP: 126/62  Pulse: (!) 103   Sitting comfortably on the sonogram table Nonlabored breathing Normal rate and rhythm Abdomen is nontender  Thank you for the opportunity to be  involved with this patient's care. Please let us  know if we can be of any further assistance.   60 minutes of time was spent reviewing the patient's chart including labs, imaging and documentation.  At least 50% of this time was spent with direct patient care discussing the diagnosis, management and prognosis of her care.  Delora Smaller MFM, [facility]   08/02/2024  11:05 AM

## 2024-08-03 ENCOUNTER — Telehealth (INDEPENDENT_AMBULATORY_CARE_PROVIDER_SITE_OTHER): Admitting: Obstetrics & Gynecology

## 2024-08-03 VITALS — BP 117/82 | HR 93

## 2024-08-03 DIAGNOSIS — Z3A19 19 weeks gestation of pregnancy: Secondary | ICD-10-CM | POA: Diagnosis not present

## 2024-08-03 DIAGNOSIS — O0992 Supervision of high risk pregnancy, unspecified, second trimester: Secondary | ICD-10-CM | POA: Diagnosis not present

## 2024-08-03 DIAGNOSIS — O09522 Supervision of elderly multigravida, second trimester: Secondary | ICD-10-CM | POA: Diagnosis not present

## 2024-08-03 DIAGNOSIS — O09812 Supervision of pregnancy resulting from assisted reproductive technology, second trimester: Secondary | ICD-10-CM

## 2024-08-03 DIAGNOSIS — Z333 Pregnant state, gestational carrier: Secondary | ICD-10-CM

## 2024-08-03 NOTE — Progress Notes (Signed)
 I connected with Emily Miller 08/03/24 at  2:35 PM EDT by: MyChart video and verified that I am speaking with the correct person using two identifiers.  Patient is located at [facility] and provider is located at [facility].     I discussed the limitations, risks, security and privacy concerns of performing an evaluation and management service by MyChart video and the availability of in person appointments. I also discussed with the patient that there may be a patient responsible charge related to this service. By engaging in this virtual visit, you consent to the provision of healthcare.  Additionally, you authorize for your insurance to be billed for the services provided during this visit.  The patient expressed understanding and agreed to proceed.  The following staff members participated in the virtual visit:  B'Aisha    PRENATAL VISIT NOTE  Subjective:  Emily Miller is a 38 y.o. H4E6986 at [redacted]w[redacted]d  for virtual visit for ongoing prenatal care.  She is currently monitored for the following issues for this high-risk pregnancy and has Inflammatory arthritis; Supervision of high-risk pregnancy; History of hyperthyroidism; Advanced maternal age in multigravida; BMI 34.0-34.9,adult; Surrogate pregnancy, antepartum; Pregnancy resulting from in vitro fertilization, second trimester; and Obesity during pregnancy in second trimester on their problem list.  Patient reports no complaints.  Contractions: Not present. Vag. Bleeding: None.  Movement: Present. Denies leaking of fluid.   The following portions of the patient's history were reviewed and updated as appropriate: allergies, current medications, past family history, past medical history, past social history, past surgical history and problem list.   Objective:   Vitals:   08/03/24 1443  BP: 117/82  Pulse: 93   Self-Obtained  Fetal Status:     Movement: Present     Assessment and Plan:  Pregnancy: G5P3013 at [redacted]w[redacted]d 1. Pregnancy resulting from in  vitro fertilization, second trimester (Primary) Normal fetal anatomy scan  2. Surrogate pregnancy, antepartum singleton  3. Supervision of high risk pregnancy in second trimester ASA daily  4. Multigravida of advanced maternal age in second trimester   Preterm labor symptoms and general obstetric precautions including but not limited to vaginal bleeding, contractions, leaking of fluid and fetal movement were reviewed in detail with the patient.  Return in about 4 weeks (around 08/31/2024).  Future Appointments  Date Time Provider Department Center  08/30/2024 11:15 AM Vannie Cornell SAUNDERS, CNM [facility]  09/27/2024  8:40 AM [facility] LAB [facility]  09/27/2024 10:15 AM Vannie Cornell SAUNDERS, CNM [facility]  10/06/2024  8:15 AM [facility] PROVIDER 1 [facility]  10/06/2024  8:30 AM [facility] US2 [facility]  10/11/2024 10:55 AM Vannie Cornell SAUNDERS, CNM [facility]  11/01/2024  8:15 AM [facility] PROVIDER 1 [facility]  11/01/2024  8:30 AM [facility] US2 [facility]  11/01/2024 10:55 AM Vannie Cornell SAUNDERS, CNM [facility]  11/15/2024 10:55 AM Vannie Cornell SAUNDERS, CNM [facility]  11/29/2024 10:55 AM Vannie Cornell SAUNDERS, CNM [facility]  12/06/2024 10:55 AM Vannie Cornell SAUNDERS, CNM [facility]  12/13/2024 10:55 AM Vannie Cornell SAUNDERS, CNM [facility]  12/20/2024 10:55 AM Vannie Cornell SAUNDERS, CNM [facility]  12/27/2024 10:55 AM Vannie Cornell SAUNDERS, CNM [facility]     Time spent on virtual visit: 15 minutes non face-to-face and chart review and documentation  Lynwood Solomons, MD

## 2024-08-30 ENCOUNTER — Ambulatory Visit: Admitting: Certified Nurse Midwife

## 2024-08-30 ENCOUNTER — Other Ambulatory Visit: Payer: Self-pay

## 2024-08-30 VITALS — BP 113/82 | HR 101 | Wt 228.0 lb

## 2024-08-30 DIAGNOSIS — Z3A23 23 weeks gestation of pregnancy: Secondary | ICD-10-CM

## 2024-08-30 DIAGNOSIS — F4321 Adjustment disorder with depressed mood: Secondary | ICD-10-CM | POA: Diagnosis not present

## 2024-08-30 DIAGNOSIS — O09522 Supervision of elderly multigravida, second trimester: Secondary | ICD-10-CM

## 2024-08-30 DIAGNOSIS — O0992 Supervision of high risk pregnancy, unspecified, second trimester: Secondary | ICD-10-CM

## 2024-08-31 NOTE — Progress Notes (Signed)
 PRENATAL VISIT NOTE  Subjective:  Emily Miller is a 38 y.o. [phone] at [redacted]w[redacted]d being seen today for ongoing prenatal care.  She is currently monitored for the following issues for this high-risk pregnancy and has Inflammatory arthritis; Supervision of high-risk pregnancy; History of hyperthyroidism; Advanced maternal age in multigravida; BMI 34.0-34.9,adult; Surrogate pregnancy, antepartum; Pregnancy resulting from in vitro fertilization, second trimester; and Obesity during pregnancy in second trimester on their problem list.  Patient reports doing well physically, has been under a lot of stress recently including the loss of her mother in October.  Contractions: Not present. Vag. Bleeding: None.  Movement: Present. Denies leaking of fluid.   The following portions of the patient's history were reviewed and updated as appropriate: allergies, current medications, past family history, past medical history, past social history, past surgical history and problem list.   Objective:   Vitals:   08/30/24 1131  BP: 113/82  Pulse: (!) 101  Weight: 228 lb (103.4 kg)   Fetal Status:  Fetal Heart Rate (bpm): 162   Movement: Present    General: Alert, oriented and cooperative. Patient is in no acute distress.  Skin: Skin is warm and dry. No rash noted.   Cardiovascular: Normal heart rate noted  Respiratory: Normal respiratory effort, no problems with respiration noted  Abdomen: Soft, gravid, appropriate for gestational age.  Pain/Pressure: Absent     Pelvic: Cervical exam deferred        Extremities: Normal range of motion.  Edema: None  Mental Status: Normal mood and affect. Normal behavior. Normal judgment and thought content.      06/07/2024    2:33 PM 11/10/2023   10:12 AM 06/24/2022    3:19 PM  Depression screen PHQ 2/9  Decreased Interest 0 0 0  Down, Depressed, Hopeless 0 0 0  PHQ - 2 Score 0 0 0  Altered sleeping 0 0 0  Tired, decreased energy 0 0 0  Change in appetite 0 0 0   Feeling bad or failure about yourself  0 0 0  Trouble concentrating 0 0 0  Moving slowly or fidgety/restless 0 0 0  Suicidal thoughts 0 0 0  PHQ-9 Score 0  0  0      Data saved with a previous flowsheet row definition        06/07/2024    2:33 PM 11/10/2023   10:12 AM 06/24/2022    3:18 PM 03/24/2021    3:51 PM  GAD 7 : Generalized Anxiety Score  Nervous, Anxious, on Edge 0 0 0 0  Control/stop worrying 0 0 0 0  Worry too much - different things 0 0 0 0  Trouble relaxing 0 0 0 0  Restless 0 0 0 0  Easily annoyed or irritable 0 0 0 0  Afraid - awful might happen 0 0 0 0  Total GAD 7 Score 0 0 0 0    Assessment and Plan:  Pregnancy: [id number] at [redacted]w[redacted]d 1. Supervision of high risk pregnancy in second trimester - Doing well, feeling regular and vigorous fetal movement   2. [redacted] weeks gestation of pregnancy (Primary) - Routine OB care including anticipatory guidance re GTT at next visit.  3. Multigravida of advanced maternal age in second trimester - Next growth scan on 12/26.  4. Grief - Allowed time for discussion of the loss, expressing/processing of emotions. Has a strong support system.  Preterm labor symptoms and general obstetric precautions including but not limited to vaginal bleeding, contractions, leaking of  fluid and fetal movement were reviewed in detail with the patient. Please refer to After Visit Summary for other counseling recommendations.   Return in about 4 weeks (around 09/27/2024) for IN-PERSON, HOB/GTT.  Future Appointments  Date Time Provider Department Center  09/27/2024  8:40 AM [facility]  09/27/2024 10:15 AM Vannie Cornell SAUNDERS, CNM [facility]  10/06/2024  8:15 AM [facility] PROVIDER 1 [facility]  10/06/2024  8:30 AM [facility] US2 [facility]  10/11/2024 10:55 AM Vannie Cornell SAUNDERS, CNM [facility]  11/01/2024  8:15 AM [facility] PROVIDER 1 [facility]  11/01/2024  8:30 AM [facility] US2 [facility]  11/01/2024 10:55 AM Vannie Cornell SAUNDERS, CNM  [facility]  11/15/2024 10:55 AM Vannie Cornell SAUNDERS, CNM [facility]  11/29/2024 10:55 AM Vannie Cornell SAUNDERS, CNM [facility]  12/06/2024 10:55 AM Vannie Cornell SAUNDERS, CNM [facility]  12/13/2024 10:55 AM Vannie Cornell SAUNDERS, CNM [facility]  12/20/2024 10:55 AM Vannie Cornell SAUNDERS, CNM [facility]  12/27/2024 10:55 AM Vannie Cornell SAUNDERS, CNM [facility]    Cornell SAUNDERS Vannie, CNM

## 2024-09-23 ENCOUNTER — Encounter: Payer: Self-pay | Admitting: Certified Nurse Midwife

## 2024-09-25 ENCOUNTER — Other Ambulatory Visit: Payer: Self-pay

## 2024-09-25 DIAGNOSIS — O0992 Supervision of high risk pregnancy, unspecified, second trimester: Secondary | ICD-10-CM

## 2024-09-27 ENCOUNTER — Ambulatory Visit (INDEPENDENT_AMBULATORY_CARE_PROVIDER_SITE_OTHER): Admitting: Certified Nurse Midwife

## 2024-09-27 ENCOUNTER — Other Ambulatory Visit: Payer: Self-pay

## 2024-09-27 ENCOUNTER — Other Ambulatory Visit

## 2024-09-27 VITALS — BP 122/79 | HR 113 | Wt 247.3 lb

## 2024-09-27 DIAGNOSIS — Z3A27 27 weeks gestation of pregnancy: Secondary | ICD-10-CM | POA: Diagnosis not present

## 2024-09-27 DIAGNOSIS — O0992 Supervision of high risk pregnancy, unspecified, second trimester: Secondary | ICD-10-CM

## 2024-09-27 DIAGNOSIS — O0993 Supervision of high risk pregnancy, unspecified, third trimester: Secondary | ICD-10-CM | POA: Diagnosis not present

## 2024-09-27 DIAGNOSIS — O26893 Other specified pregnancy related conditions, third trimester: Secondary | ICD-10-CM

## 2024-09-27 DIAGNOSIS — O26899 Other specified pregnancy related conditions, unspecified trimester: Secondary | ICD-10-CM | POA: Insufficient documentation

## 2024-09-27 DIAGNOSIS — Z6791 Unspecified blood type, Rh negative: Secondary | ICD-10-CM | POA: Diagnosis not present

## 2024-09-27 MED ORDER — RHO D IMMUNE GLOBULIN 1500 UNIT/2ML IJ SOSY
300.0000 ug | PREFILLED_SYRINGE | Freq: Once | INTRAMUSCULAR | Status: AC
  Administered 2024-09-27: 12:00:00 300 ug via INTRAMUSCULAR

## 2024-09-28 LAB — GLUCOSE TOLERANCE, 1 HOUR: Glucose, 1Hr PP: 128 mg/dL (ref 70–199)

## 2024-09-28 LAB — CBC
Hematocrit: 35.6 % (ref 34.0–46.6)
Hemoglobin: 11.9 g/dL (ref 11.1–15.9)
MCH: 31.6 pg (ref 26.6–33.0)
MCHC: 33.4 g/dL (ref 31.5–35.7)
MCV: 94 fL (ref 79–97)
Platelets: 241 x10E3/uL (ref 150–450)
RBC: 3.77 x10E6/uL (ref 3.77–5.28)
RDW: 12.5 % (ref 11.7–15.4)
WBC: 8.9 x10E3/uL (ref 3.4–10.8)

## 2024-09-28 LAB — ANTIBODY SCREEN: Antibody Screen: NEGATIVE

## 2024-09-28 LAB — SYPHILIS: RPR W/REFLEX TO RPR TITER AND TREPONEMAL ANTIBODIES, TRADITIONAL SCREENING AND DIAGNOSIS ALGORITHM: RPR Ser Ql: NONREACTIVE

## 2024-09-28 LAB — HIV ANTIBODY (ROUTINE TESTING W REFLEX): HIV Screen 4th Generation wRfx: NONREACTIVE

## 2024-10-03 NOTE — Progress Notes (Signed)
 "  PRENATAL VISIT NOTE  Subjective:  Emily Miller is a 38 y.o. H4E6986 at [redacted]w[redacted]d being seen today for ongoing prenatal care.  She is currently monitored for the following issues for this high-risk pregnancy and has Inflammatory arthritis; Supervision of high-risk pregnancy; History of hyperthyroidism; Advanced maternal age in multigravida; BMI 34.0-34.9,adult; Surrogate pregnancy, antepartum; Pregnancy resulting from in vitro fertilization, second trimester; Obesity during pregnancy in second trimester; and Rh negative status during pregnancy on their problem list.  Patient reports no complaints.  Contractions: Not present. Vag. Bleeding: None.  Movement: Present. Denies leaking of fluid.   The following portions of the patient's history were reviewed and updated as appropriate: allergies, current medications, past family history, past medical history, past social history, past surgical history and problem list.   Objective:   Vitals:   09/27/24 1031  BP: 122/79  Pulse: (!) 113  Weight: 247 lb 4.8 oz (112.2 kg)    Fetal Status:  Fetal Heart Rate (bpm): 153   Movement: Present    General: Alert, oriented and cooperative. Patient is in no acute distress.  Skin: Skin is warm and dry. No rash noted.   Cardiovascular: Normal heart rate noted  Respiratory: Normal respiratory effort, no problems with respiration noted  Abdomen: Soft, gravid, appropriate for gestational age.  Pain/Pressure: Absent     Pelvic: Cervical exam deferred        Extremities: Normal range of motion.  Edema: None  Mental Status: Normal mood and affect. Normal behavior. Normal judgment and thought content.      06/07/2024    2:33 PM 11/10/2023   10:12 AM 06/24/2022    3:19 PM  Depression screen PHQ 2/9  Decreased Interest 0 0 0  Down, Depressed, Hopeless 0 0 0  PHQ - 2 Score 0 0 0  Altered sleeping 0 0 0  Tired, decreased energy 0 0 0  Change in appetite 0 0 0  Feeling bad or failure about yourself  0 0 0   Trouble concentrating 0 0 0  Moving slowly or fidgety/restless 0 0 0  Suicidal thoughts 0 0 0  PHQ-9 Score 0  0  0      Data saved with a previous flowsheet row definition        06/07/2024    2:33 PM 11/10/2023   10:12 AM 06/24/2022    3:18 PM 03/24/2021    3:51 PM  GAD 7 : Generalized Anxiety Score  Nervous, Anxious, on Edge 0 0 0 0  Control/stop worrying 0 0 0 0  Worry too much - different things 0 0 0 0  Trouble relaxing 0 0 0 0  Restless 0 0 0 0  Easily annoyed or irritable 0 0 0 0  Afraid - awful might happen 0 0 0 0  Total GAD 7 Score 0 0 0 0    Assessment and Plan:  Pregnancy: [id number] at [redacted]w[redacted]d 1. Supervision of high risk pregnancy in third trimester (Primary) - Doing well, feeling regular and vigorous fetal movement   2. [redacted] weeks gestation of pregnancy - Routine OB care  - Prefers 1hr GTT, declined 2hr - RPR W/RFLX TO RPR TITER, TREPONEMAL AB, SCREEN AND DIAGNOSIS - HIV Antibody (routine testing w rflx) - CBC - Glucose Tolerance, 1 Hour  3. Rh negative status during pregnancy in third trimester - rho (d) immune globulin  (RHIG/RHOPHYLAC ) injection 300 mcg - Antibody screen  Preterm labor symptoms and general obstetric precautions including but not limited to vaginal bleeding,  contractions, leaking of fluid and fetal movement were reviewed in detail with the patient. Please refer to After Visit Summary for other counseling recommendations.   Return for [facility], IN-PERSON.  Future Appointments  Date Time Provider Department Center  10/06/2024  8:15 AM [facility] PROVIDER 1 [facility]  10/06/2024  8:30 AM [facility] US2 [facility]  10/11/2024 10:55 AM Vannie Cornell SAUNDERS, CNM [facility]  11/01/2024  8:15 AM [facility] PROVIDER 1 [facility]  11/01/2024  8:30 AM [facility] US2 [facility]  11/01/2024 10:55 AM Vannie Cornell SAUNDERS, CNM [facility]  11/15/2024 10:55 AM Vannie Cornell SAUNDERS, CNM [facility]  11/29/2024 10:55 AM Vannie Cornell SAUNDERS, CNM [facility]  12/06/2024 10:55 AM  Vannie Cornell SAUNDERS, CNM [facility]  12/13/2024 10:55 AM Vannie Cornell SAUNDERS, CNM [facility]  12/20/2024 10:55 AM Vannie Cornell SAUNDERS, CNM [facility]  12/27/2024 10:55 AM Vannie Cornell SAUNDERS, CNM [facility]    Cornell SAUNDERS Vannie, CNM "

## 2024-10-04 ENCOUNTER — Encounter: Payer: Self-pay | Admitting: Certified Nurse Midwife

## 2024-10-06 ENCOUNTER — Ambulatory Visit

## 2024-10-06 ENCOUNTER — Encounter: Payer: Self-pay | Admitting: Certified Nurse Midwife

## 2024-10-06 DIAGNOSIS — M1990 Unspecified osteoarthritis, unspecified site: Secondary | ICD-10-CM | POA: Insufficient documentation

## 2024-10-06 DIAGNOSIS — E559 Vitamin D deficiency, unspecified: Secondary | ICD-10-CM | POA: Insufficient documentation

## 2024-10-11 ENCOUNTER — Ambulatory Visit: Admitting: Certified Nurse Midwife

## 2024-10-11 ENCOUNTER — Other Ambulatory Visit: Payer: Self-pay

## 2024-10-11 VITALS — BP 126/80 | HR 96 | Wt 251.1 lb

## 2024-10-11 DIAGNOSIS — O0993 Supervision of high risk pregnancy, unspecified, third trimester: Secondary | ICD-10-CM

## 2024-10-11 DIAGNOSIS — O0992 Supervision of high risk pregnancy, unspecified, second trimester: Secondary | ICD-10-CM

## 2024-10-11 DIAGNOSIS — Z3A29 29 weeks gestation of pregnancy: Secondary | ICD-10-CM

## 2024-10-11 DIAGNOSIS — Z23 Encounter for immunization: Secondary | ICD-10-CM | POA: Diagnosis not present

## 2024-10-11 NOTE — Progress Notes (Signed)
 "  PRENATAL VISIT NOTE  Subjective:  Emily Miller is a 38 y.o. [id number] at [redacted]w[redacted]d being seen today for ongoing prenatal care.  She is currently monitored for the following issues for this high-risk pregnancy and has Inflammatory arthritis; Supervision of high-risk pregnancy; Advanced maternal age in multigravida; BMI 34.0-34.9,adult; Surrogate pregnancy, antepartum; Pregnancy resulting from in vitro fertilization, second trimester; Obesity during pregnancy in second trimester; and Rh negative status during pregnancy on their problem list.  Patient reports no complaints.  Contractions: Not present. Vag. Bleeding: None.  Movement: Present. Denies leaking of fluid.   The following portions of the patient's history were reviewed and updated as appropriate: allergies, current medications, past family history, past medical history, past social history, past surgical history and problem list.   Objective:   Vitals:   10/11/24 1109  BP: 126/80  Pulse: 96  Weight: 251 lb 1.6 oz (113.9 kg)   Fetal Status:  Fetal Heart Rate (bpm): 150 Fundal Height: 29 cm Movement: Present    General: Alert, oriented and cooperative. Patient is in no acute distress.  Skin: Skin is warm and dry. No rash noted.   Cardiovascular: Normal heart rate noted  Respiratory: Normal respiratory effort, no problems with respiration noted  Abdomen: Soft, gravid, appropriate for gestational age.  Pain/Pressure: Absent     Pelvic: Cervical exam deferred        Extremities: Normal range of motion.  Edema: None  Mental Status: Normal mood and affect. Normal behavior. Normal judgment and thought content.      06/07/2024    2:33 PM 11/10/2023   10:12 AM 06/24/2022    3:19 PM  Depression screen PHQ 2/9  Decreased Interest 0 0 0  Down, Depressed, Hopeless 0 0 0  PHQ - 2 Score 0 0 0  Altered sleeping 0 0 0  Tired, decreased energy 0 0 0  Change in appetite 0 0 0  Feeling bad or failure about yourself  0 0 0  Trouble  concentrating 0 0 0  Moving slowly or fidgety/restless 0 0 0  Suicidal thoughts 0 0 0  PHQ-9 Score 0  0  0      Data saved with a previous flowsheet row definition       06/07/2024    2:33 PM 11/10/2023   10:12 AM 06/24/2022    3:18 PM 03/24/2021    3:51 PM  GAD 7 : Generalized Anxiety Score  Nervous, Anxious, on Edge 0 0 0 0  Control/stop worrying 0 0 0 0  Worry too much - different things 0 0 0 0  Trouble relaxing 0 0 0 0  Restless 0 0 0 0  Easily annoyed or irritable 0 0 0 0  Afraid - awful might happen 0 0 0 0  Total GAD 7 Score 0 0 0 0   Assessment and Plan:  Pregnancy: [id number] at [redacted]w[redacted]d 1. Supervision of high risk pregnancy in second trimester (Primary) - Doing well, feeling regular and vigorous fetal movement  - Tdap vaccine greater than or equal to 7yo IM  2. [redacted] weeks gestation of pregnancy - Routine OB care   Preterm labor symptoms and general obstetric precautions including but not limited to vaginal bleeding, contractions, leaking of fluid and fetal movement were reviewed in detail with the patient. Please refer to After Visit Summary for other counseling recommendations.   Return in about 3 weeks (around 11/01/2024) for IN-PERSON, HOB.  Future Appointments  Date Time Provider Department Center  10/25/2024  7:15 AM [facility] PROVIDER 1 [facility]  10/25/2024  7:30 AM [facility] US5 [facility]  11/01/2024  8:15 AM [facility] PROVIDER 1 [facility]  11/01/2024  8:30 AM [facility] US2 [facility]  11/01/2024 10:55 AM Vannie Cornell SAUNDERS, CNM [facility]  11/15/2024 10:55 AM Vannie Cornell SAUNDERS, CNM [facility]  11/29/2024 10:55 AM Vannie Cornell SAUNDERS, CNM [facility]  12/06/2024 10:55 AM Vannie Cornell SAUNDERS, CNM [facility]  12/13/2024 10:55 AM Vannie Cornell SAUNDERS, CNM [facility]  12/20/2024 10:55 AM Vannie Cornell SAUNDERS, CNM [facility]  12/27/2024 10:55 AM Vannie Cornell SAUNDERS, CNM [facility]   Cornell SAUNDERS Vannie, CNM "

## 2024-10-18 ENCOUNTER — Encounter: Payer: Self-pay | Admitting: Certified Nurse Midwife

## 2024-10-18 ENCOUNTER — Encounter: Admitting: Certified Nurse Midwife

## 2024-10-25 ENCOUNTER — Ambulatory Visit (HOSPITAL_BASED_OUTPATIENT_CLINIC_OR_DEPARTMENT_OTHER)

## 2024-10-25 ENCOUNTER — Ambulatory Visit: Attending: Obstetrics and Gynecology | Admitting: Obstetrics and Gynecology

## 2024-10-25 ENCOUNTER — Other Ambulatory Visit: Payer: Self-pay | Admitting: *Deleted

## 2024-10-25 VITALS — BP 122/72 | HR 98

## 2024-10-25 DIAGNOSIS — O09523 Supervision of elderly multigravida, third trimester: Secondary | ICD-10-CM

## 2024-10-25 DIAGNOSIS — O358XX0 Maternal care for other (suspected) fetal abnormality and damage, not applicable or unspecified: Secondary | ICD-10-CM

## 2024-10-25 DIAGNOSIS — Z3A31 31 weeks gestation of pregnancy: Secondary | ICD-10-CM

## 2024-10-25 DIAGNOSIS — O99213 Obesity complicating pregnancy, third trimester: Secondary | ICD-10-CM

## 2024-10-25 DIAGNOSIS — O09812 Supervision of pregnancy resulting from assisted reproductive technology, second trimester: Secondary | ICD-10-CM

## 2024-10-25 DIAGNOSIS — Z362 Encounter for other antenatal screening follow-up: Secondary | ICD-10-CM | POA: Diagnosis not present

## 2024-10-25 DIAGNOSIS — E669 Obesity, unspecified: Secondary | ICD-10-CM | POA: Diagnosis not present

## 2024-10-25 DIAGNOSIS — O09813 Supervision of pregnancy resulting from assisted reproductive technology, third trimester: Secondary | ICD-10-CM

## 2024-10-25 DIAGNOSIS — Z6834 Body mass index (BMI) 34.0-34.9, adult: Secondary | ICD-10-CM

## 2024-10-25 DIAGNOSIS — O0993 Supervision of high risk pregnancy, unspecified, third trimester: Secondary | ICD-10-CM

## 2024-10-25 DIAGNOSIS — O99212 Obesity complicating pregnancy, second trimester: Secondary | ICD-10-CM

## 2024-10-25 DIAGNOSIS — O09522 Supervision of elderly multigravida, second trimester: Secondary | ICD-10-CM

## 2024-10-25 NOTE — Progress Notes (Signed)
 After review, MFM consult with provider is not indicated for today  Arna Ranks, MD 10/25/2024 8:26 AM  [facility]

## 2024-11-01 ENCOUNTER — Ambulatory Visit: Admitting: Certified Nurse Midwife

## 2024-11-01 ENCOUNTER — Ambulatory Visit

## 2024-11-01 ENCOUNTER — Encounter: Payer: Self-pay | Admitting: Certified Nurse Midwife

## 2024-11-01 ENCOUNTER — Other Ambulatory Visit: Payer: Self-pay

## 2024-11-01 VITALS — BP 130/81 | HR 113 | Wt 258.5 lb

## 2024-11-01 DIAGNOSIS — Z3A32 32 weeks gestation of pregnancy: Secondary | ICD-10-CM | POA: Diagnosis not present

## 2024-11-01 DIAGNOSIS — O0993 Supervision of high risk pregnancy, unspecified, third trimester: Secondary | ICD-10-CM | POA: Diagnosis not present

## 2024-11-01 NOTE — Progress Notes (Signed)
 "   PRENATAL VISIT NOTE  Subjective:  Emily Miller is a 39 y.o. H4E6986 at [redacted]w[redacted]d being seen today for ongoing prenatal care.  She is currently monitored for the following issues for this high-risk pregnancy and has Inflammatory arthritis; Supervision of high-risk pregnancy; Advanced maternal age in multigravida; BMI 34.0-34.9,adult; Surrogate pregnancy, antepartum; Pregnancy resulting from in vitro fertilization, second trimester; Obesity during pregnancy in second trimester; and Rh negative status during pregnancy on their problem list.  Patient reports no complaints.  Contractions: Not present. Vag. Bleeding: None.  Movement: Present. Denies leaking of fluid.   The following portions of the patient's history were reviewed and updated as appropriate: allergies, current medications, past family history, past medical history, past social history, past surgical history and problem list.   Objective:   Vitals:   11/01/24 1118  BP: 130/81  Pulse: (!) 113  Weight: 258 lb 8 oz (117.3 kg)   Fetal Status:  Fetal Heart Rate (bpm): 150   Movement: Present    General: Alert, oriented and cooperative. Patient is in no acute distress.  Skin: Skin is warm and dry. No rash noted.   Cardiovascular: Normal heart rate noted  Respiratory: Normal respiratory effort, no problems with respiration noted  Abdomen: Soft, gravid, appropriate for gestational age.  Pain/Pressure: Absent     Pelvic: Cervical exam deferred        Extremities: Normal range of motion.  Edema: None  Mental Status: Normal mood and affect. Normal behavior. Normal judgment and thought content.      06/07/2024    2:33 PM 11/10/2023   10:12 AM 06/24/2022    3:19 PM  Depression screen PHQ 2/9  Decreased Interest 0 0 0  Down, Depressed, Hopeless 0 0 0  PHQ - 2 Score 0 0 0  Altered sleeping 0 0 0  Tired, decreased energy 0 0 0  Change in appetite 0 0 0  Feeling bad or failure about yourself  0 0 0  Trouble concentrating 0 0 0   Moving slowly or fidgety/restless 0 0 0  Suicidal thoughts 0 0 0  PHQ-9 Score 0  0  0      Data saved with a previous flowsheet row definition       06/07/2024    2:33 PM 11/10/2023   10:12 AM 06/24/2022    3:18 PM 03/24/2021    3:51 PM  GAD 7 : Generalized Anxiety Score  Nervous, Anxious, on Edge 0  0  0  0   Control/stop worrying 0  0  0  0   Worry too much - different things 0  0  0  0   Trouble relaxing 0  0  0  0   Restless 0  0  0  0   Easily annoyed or irritable 0  0  0  0   Afraid - awful might happen 0  0  0  0   Total GAD 7 Score 0 0 0 0     Data saved with a previous flowsheet row definition   Assessment and Plan:  Pregnancy: [id number] at [redacted]w[redacted]d 1. Supervision of high risk pregnancy in third trimester (Primary) - Doing well, feeling regular and vigorous fetal movement   2. [redacted] weeks gestation of pregnancy - Routine OB care  - Wants to plan IOL for [redacted]w[redacted]d  Preterm labor symptoms and general obstetric precautions including but not limited to vaginal bleeding, contractions, leaking of fluid and fetal movement were reviewed in detail  with the patient. Please refer to After Visit Summary for other counseling recommendations.   Return in about 2 weeks (around 11/15/2024) for IN-PERSON, HOB.  Future Appointments  Date Time Provider Department Center  11/08/2024  8:45 AM [facility]  11/15/2024  9:45 AM [facility] NST [facility]  11/15/2024 10:55 AM Vannie Cornell SAUNDERS, CNM [facility]  11/29/2024  9:45 AM [facility] NST [facility]  11/29/2024 10:55 AM Vannie Cornell SAUNDERS, CNM [facility]  12/06/2024  9:15 AM [facility] PROVIDER 1 [facility]  12/06/2024  9:30 AM [facility] US1 [facility]  12/06/2024 10:55 AM Vannie Cornell SAUNDERS, CNM [facility]  12/13/2024  9:15 AM [facility] PROVIDER 1 [facility]  12/13/2024  9:30 AM [facility] US1 [facility]  12/13/2024 10:55 AM Vannie Cornell SAUNDERS, CNM [facility]  12/20/2024 10:55 AM Vannie Cornell SAUNDERS, CNM [facility]  12/27/2024 10:55 AM Vannie Cornell SAUNDERS, CNM [facility]    Cornell SAUNDERS Vannie, CNM "

## 2024-11-08 ENCOUNTER — Ambulatory Visit: Attending: Obstetrics and Gynecology | Admitting: *Deleted

## 2024-11-08 DIAGNOSIS — Z3A33 33 weeks gestation of pregnancy: Secondary | ICD-10-CM | POA: Diagnosis not present

## 2024-11-08 DIAGNOSIS — O09813 Supervision of pregnancy resulting from assisted reproductive technology, third trimester: Secondary | ICD-10-CM | POA: Diagnosis not present

## 2024-11-08 DIAGNOSIS — Z333 Pregnant state, gestational carrier: Secondary | ICD-10-CM | POA: Diagnosis not present

## 2024-11-08 DIAGNOSIS — O09523 Supervision of elderly multigravida, third trimester: Secondary | ICD-10-CM | POA: Diagnosis not present

## 2024-11-08 NOTE — Procedures (Signed)
 Emily Miller Dec 03, 1985 [redacted]w[redacted]d  Fetus A Non-Stress Test Interpretation for 11/08/24  Indication: Advanced Maternal Age >40 years and IVF, surrogate  Fetal Heart Rate A Mode: External Baseline Rate (A): 145 bpm Variability: Moderate Accelerations: 15 x 15 Decelerations: None Multiple birth?: No  Uterine Activity Mode: Toco Contraction Frequency (min): none Resting Tone Palpated: Relaxed  Interpretation (Fetal Testing) Nonstress Test Interpretation: Reactive Comments: Tracing reviewed  by Dr. Arna

## 2024-11-15 ENCOUNTER — Ambulatory Visit: Attending: Obstetrics | Admitting: *Deleted

## 2024-11-15 ENCOUNTER — Ambulatory Visit: Admitting: Certified Nurse Midwife

## 2024-11-15 ENCOUNTER — Other Ambulatory Visit: Payer: Self-pay

## 2024-11-15 VITALS — BP 114/80 | HR 94 | Wt 261.7 lb

## 2024-11-15 DIAGNOSIS — O09523 Supervision of elderly multigravida, third trimester: Secondary | ICD-10-CM

## 2024-11-15 DIAGNOSIS — O0993 Supervision of high risk pregnancy, unspecified, third trimester: Secondary | ICD-10-CM

## 2024-11-15 DIAGNOSIS — N393 Stress incontinence (female) (male): Secondary | ICD-10-CM

## 2024-11-15 DIAGNOSIS — O09813 Supervision of pregnancy resulting from assisted reproductive technology, third trimester: Secondary | ICD-10-CM | POA: Diagnosis not present

## 2024-11-15 DIAGNOSIS — Z3A34 34 weeks gestation of pregnancy: Secondary | ICD-10-CM | POA: Insufficient documentation

## 2024-11-15 DIAGNOSIS — O99213 Obesity complicating pregnancy, third trimester: Secondary | ICD-10-CM | POA: Insufficient documentation

## 2024-11-15 NOTE — Progress Notes (Unsigned)
 "  PRENATAL VISIT NOTE  Subjective:  Emily Miller is a 39 y.o. H4E6986 at [redacted]w[redacted]d being seen today for ongoing prenatal care.  She is currently monitored for the following issues for this {Blank single:19197::high-risk,low-risk} pregnancy and has Inflammatory arthritis; Supervision of high-risk pregnancy; Advanced maternal age in multigravida; BMI 34.0-34.9,adult; Surrogate pregnancy, antepartum; Pregnancy resulting from in vitro fertilization, second trimester; Obesity during pregnancy in second trimester; and Rh negative status during pregnancy on their problem list.  Patient reports {sx:14538}.   . Vag. Bleeding: None.  Movement: Present. Denies leaking of fluid.   The following portions of the patient's history were reviewed and updated as appropriate: allergies, current medications, past family history, past medical history, past social history, past surgical history and problem list.   Objective:   Vitals:   11/15/24 1202  BP: 114/80  Pulse: 94  Weight: 261 lb 11.2 oz (118.7 kg)    Fetal Status:  Fetal Heart Rate (bpm): 154   Movement: Present    General: Alert, oriented and cooperative. Patient is in no acute distress.  Skin: Skin is warm and dry. No rash noted.   Cardiovascular: Normal heart rate noted  Respiratory: Normal respiratory effort, no problems with respiration noted  Abdomen: Soft, gravid, appropriate for gestational age.  Pain/Pressure: Absent     Pelvic: {Blank single:19197::Cervical exam performed in the presence of a chaperone,Cervical exam deferred}        Extremities: Normal range of motion.  Edema: Trace  Mental Status: Normal mood and affect. Normal behavior. Normal judgment and thought content.      06/07/2024    2:33 PM 11/10/2023   10:12 AM 06/24/2022    3:19 PM  Depression screen PHQ 2/9  Decreased Interest 0 0 0  Down, Depressed, Hopeless 0 0 0  PHQ - 2 Score 0 0 0  Altered sleeping 0 0 0  Tired, decreased energy 0 0 0  Change in appetite  0 0 0  Feeling bad or failure about yourself  0 0 0  Trouble concentrating 0 0 0  Moving slowly or fidgety/restless 0 0 0  Suicidal thoughts 0 0 0  PHQ-9 Score 0  0  0      Data saved with a previous flowsheet row definition        06/07/2024    2:33 PM 11/10/2023   10:12 AM 06/24/2022    3:18 PM 03/24/2021    3:51 PM  GAD 7 : Generalized Anxiety Score  Nervous, Anxious, on Edge 0  0  0  0   Control/stop worrying 0  0  0  0   Worry too much - different things 0  0  0  0   Trouble relaxing 0  0  0  0   Restless 0  0  0  0   Easily annoyed or irritable 0  0  0  0   Afraid - awful might happen 0  0  0  0   Total GAD 7 Score 0 0 0 0     Data saved with a previous flowsheet row definition    Assessment and Plan:  Pregnancy: [id number] at [redacted]w[redacted]d 1. Supervision of high risk pregnancy in third trimester (Primary) ***  2. [redacted] weeks gestation of pregnancy - Routine OB care including anticipatory guidance re GBS at next visit - Pt interested in waterbirth and has attended the class.  - Reviewed conditions in labor that will risk her out of water immersion including thick  meconium or blood stained amniotic fluid, non-reassuring fetal status on monitor, excessive bleeding, hypertension, dizziness, use of IV meds, damaged equipment or staffing that does not allow for water immersion, etc.  - The attending midwife must be on the unit for water immersion to begin; pt understands this may delay the start of water immersion. - Reminded pt that signing consent in labor at the hospital also acknowledges they will exit the tub if the attending midwife requests. - Consent given to patient for review.  Consent will be reviewed and signed at the hospital by the waterbirth provider prior to use of the tub. - Discussed other labor support options if waterbirth becomes unavailable, including position change, freedom of movement, use of birthing ball, and/or use of hydrotherapy in the shower (dependent upon  medical condition/provider discretion).   3. Stress incontinence *** - Ambulatory referral to Physical Therapy  4. Multigravida of advanced maternal age in third trimester ***  {Blank single:19197::Term,Preterm} labor symptoms and general obstetric precautions including but not limited to vaginal bleeding, contractions, leaking of fluid and fetal movement were reviewed in detail with the patient. Please refer to After Visit Summary for other counseling recommendations.   Return in about 2 weeks (around 11/29/2024) for HOB/GBS, CNM, IN-PERSON.  Future Appointments  Date Time Provider Department Center  11/22/2024  8:45 AM [facility] N[facility]  11/29/2024 10:55 AM Vannie Cornell SAUNDERS, CNM [facility]  11/29/2024 11:15 AM [facility] PROVIDER 1 [facility]  11/29/2024 11:30 AM [facility] US5 [facility]  12/06/2024  9:15 AM [facility] PROVIDER 1 [facility]  12/06/2024  9:30 AM [facility] US1 [facility]  12/06/2024 10:55 AM Vannie Cornell SAUNDERS, CNM [facility]  12/13/2024  9:15 AM [facility] PROVIDER 1 [facility]  12/13/2024  9:30 AM [facility] US1 [facility]  12/13/2024 10:55 AM Vannie Cornell SAUNDERS, CNM [facility]  12/20/2024 10:55 AM Vannie Cornell SAUNDERS, CNM [facility]  12/27/2024 10:55 AM Vannie Cornell SAUNDERS, CNM [facility]    Cornell SAUNDERS Vannie, CNM "

## 2024-11-15 NOTE — Procedures (Signed)
 Emily Miller 1985/12/28 [redacted]w[redacted]d  Fetus A Non-Stress Test Interpretation for 11/15/24  Indication: Advanced Maternal Age >40 years and IVF  Fetal Heart Rate A Mode: External Baseline Rate (A): 150 bpm Variability: Moderate Accelerations: 15 x 15 Decelerations: None Multiple birth?: No  Uterine Activity Mode: Toco Contraction Frequency (min): none Resting Tone Palpated: Relaxed  Interpretation (Fetal Testing) Nonstress Test Interpretation: Reactive Comments: Tracing reviewed by Dr. Ileana

## 2024-11-22 ENCOUNTER — Ambulatory Visit

## 2024-11-29 ENCOUNTER — Ambulatory Visit

## 2024-11-29 ENCOUNTER — Other Ambulatory Visit

## 2024-11-29 ENCOUNTER — Encounter: Admitting: Certified Nurse Midwife

## 2024-12-06 ENCOUNTER — Encounter: Admitting: Certified Nurse Midwife

## 2024-12-06 ENCOUNTER — Ambulatory Visit

## 2024-12-06 ENCOUNTER — Other Ambulatory Visit

## 2024-12-13 ENCOUNTER — Other Ambulatory Visit

## 2024-12-13 ENCOUNTER — Ambulatory Visit

## 2024-12-13 ENCOUNTER — Encounter: Admitting: Certified Nurse Midwife

## 2024-12-20 ENCOUNTER — Encounter: Admitting: Certified Nurse Midwife

## 2024-12-24 ENCOUNTER — Inpatient Hospital Stay (HOSPITAL_COMMUNITY): Admit: 2024-12-24

## 2024-12-27 ENCOUNTER — Encounter: Admitting: Certified Nurse Midwife
